# Patient Record
Sex: Female | Born: 1960 | Race: White | Hispanic: No | Marital: Single | Smoking: Current every day smoker
Health system: Southern US, Community
[De-identification: ages and names within clinical notes are randomized; demographics above are authoritative.]

## PROBLEM LIST (undated history)

## (undated) DIAGNOSIS — E039 Hypothyroidism, unspecified: Secondary | ICD-10-CM

## (undated) DIAGNOSIS — A4902 Methicillin resistant Staphylococcus aureus infection, unspecified site: Secondary | ICD-10-CM

## (undated) DIAGNOSIS — F319 Bipolar disorder, unspecified: Secondary | ICD-10-CM

## (undated) DIAGNOSIS — I10 Essential (primary) hypertension: Secondary | ICD-10-CM

## (undated) DIAGNOSIS — R569 Unspecified convulsions: Secondary | ICD-10-CM

## (undated) DIAGNOSIS — F1010 Alcohol abuse, uncomplicated: Secondary | ICD-10-CM

## (undated) HISTORY — PX: LEG SURGERY: SHX1003

## (undated) HISTORY — DX: Hypothyroidism, unspecified: E03.9

---

## 1998-04-03 ENCOUNTER — Ambulatory Visit (HOSPITAL_COMMUNITY): Admission: RE | Admit: 1998-04-03 | Discharge: 1998-04-03 | Payer: Self-pay | Admitting: Obstetrics and Gynecology

## 1998-07-09 ENCOUNTER — Encounter: Admission: RE | Admit: 1998-07-09 | Discharge: 1998-08-29 | Payer: Self-pay

## 1999-08-17 ENCOUNTER — Encounter: Admission: RE | Admit: 1999-08-17 | Discharge: 1999-08-17 | Payer: Self-pay | Admitting: Orthopedic Surgery

## 1999-08-17 ENCOUNTER — Encounter: Payer: Self-pay | Admitting: Orthopedic Surgery

## 1999-09-28 ENCOUNTER — Inpatient Hospital Stay (HOSPITAL_COMMUNITY): Admission: EM | Admit: 1999-09-28 | Discharge: 1999-10-03 | Payer: Self-pay | Admitting: Emergency Medicine

## 1999-09-28 ENCOUNTER — Encounter: Payer: Self-pay | Admitting: Emergency Medicine

## 1999-09-28 ENCOUNTER — Encounter: Payer: Self-pay | Admitting: Orthopedic Surgery

## 2000-08-17 ENCOUNTER — Emergency Department (HOSPITAL_COMMUNITY): Admission: EM | Admit: 2000-08-17 | Discharge: 2000-08-17 | Payer: Self-pay | Admitting: Emergency Medicine

## 2003-05-30 ENCOUNTER — Encounter (INDEPENDENT_AMBULATORY_CARE_PROVIDER_SITE_OTHER): Payer: Self-pay | Admitting: Specialist

## 2003-05-30 ENCOUNTER — Encounter: Admission: RE | Admit: 2003-05-30 | Discharge: 2003-05-30 | Payer: Self-pay | Admitting: Obstetrics and Gynecology

## 2006-05-06 ENCOUNTER — Ambulatory Visit: Payer: Self-pay | Admitting: Hospitalist

## 2006-06-09 ENCOUNTER — Emergency Department (HOSPITAL_COMMUNITY): Admission: EM | Admit: 2006-06-09 | Discharge: 2006-06-10 | Payer: Self-pay | Admitting: Emergency Medicine

## 2006-06-11 ENCOUNTER — Ambulatory Visit: Payer: Self-pay | Admitting: Hospitalist

## 2006-06-17 ENCOUNTER — Ambulatory Visit (HOSPITAL_COMMUNITY): Admission: RE | Admit: 2006-06-17 | Discharge: 2006-06-17 | Payer: Self-pay | Admitting: Internal Medicine

## 2006-06-24 ENCOUNTER — Ambulatory Visit: Payer: Self-pay | Admitting: Internal Medicine

## 2006-06-24 ENCOUNTER — Ambulatory Visit (HOSPITAL_COMMUNITY): Admission: RE | Admit: 2006-06-24 | Discharge: 2006-06-24 | Payer: Self-pay | Admitting: Internal Medicine

## 2006-07-10 ENCOUNTER — Ambulatory Visit: Payer: Self-pay | Admitting: Internal Medicine

## 2006-07-17 ENCOUNTER — Ambulatory Visit (HOSPITAL_COMMUNITY): Admission: RE | Admit: 2006-07-17 | Discharge: 2006-07-17 | Payer: Self-pay | Admitting: Internal Medicine

## 2006-10-26 ENCOUNTER — Telehealth: Payer: Self-pay | Admitting: *Deleted

## 2006-12-14 ENCOUNTER — Ambulatory Visit: Payer: Self-pay | Admitting: Internal Medicine

## 2006-12-14 DIAGNOSIS — F319 Bipolar disorder, unspecified: Secondary | ICD-10-CM | POA: Insufficient documentation

## 2006-12-14 DIAGNOSIS — J45909 Unspecified asthma, uncomplicated: Secondary | ICD-10-CM | POA: Insufficient documentation

## 2006-12-14 DIAGNOSIS — R569 Unspecified convulsions: Secondary | ICD-10-CM

## 2006-12-18 IMAGING — CR DG CHEST 2V
2 series · 2 of 2 positions shown · non-contrast
Comparison: 06/09/06.

CLINICAL DATA: Pneumonia.  
 CHEST - 2 VIEW - 07/17/06:

[view not recorded (1 of 2)]
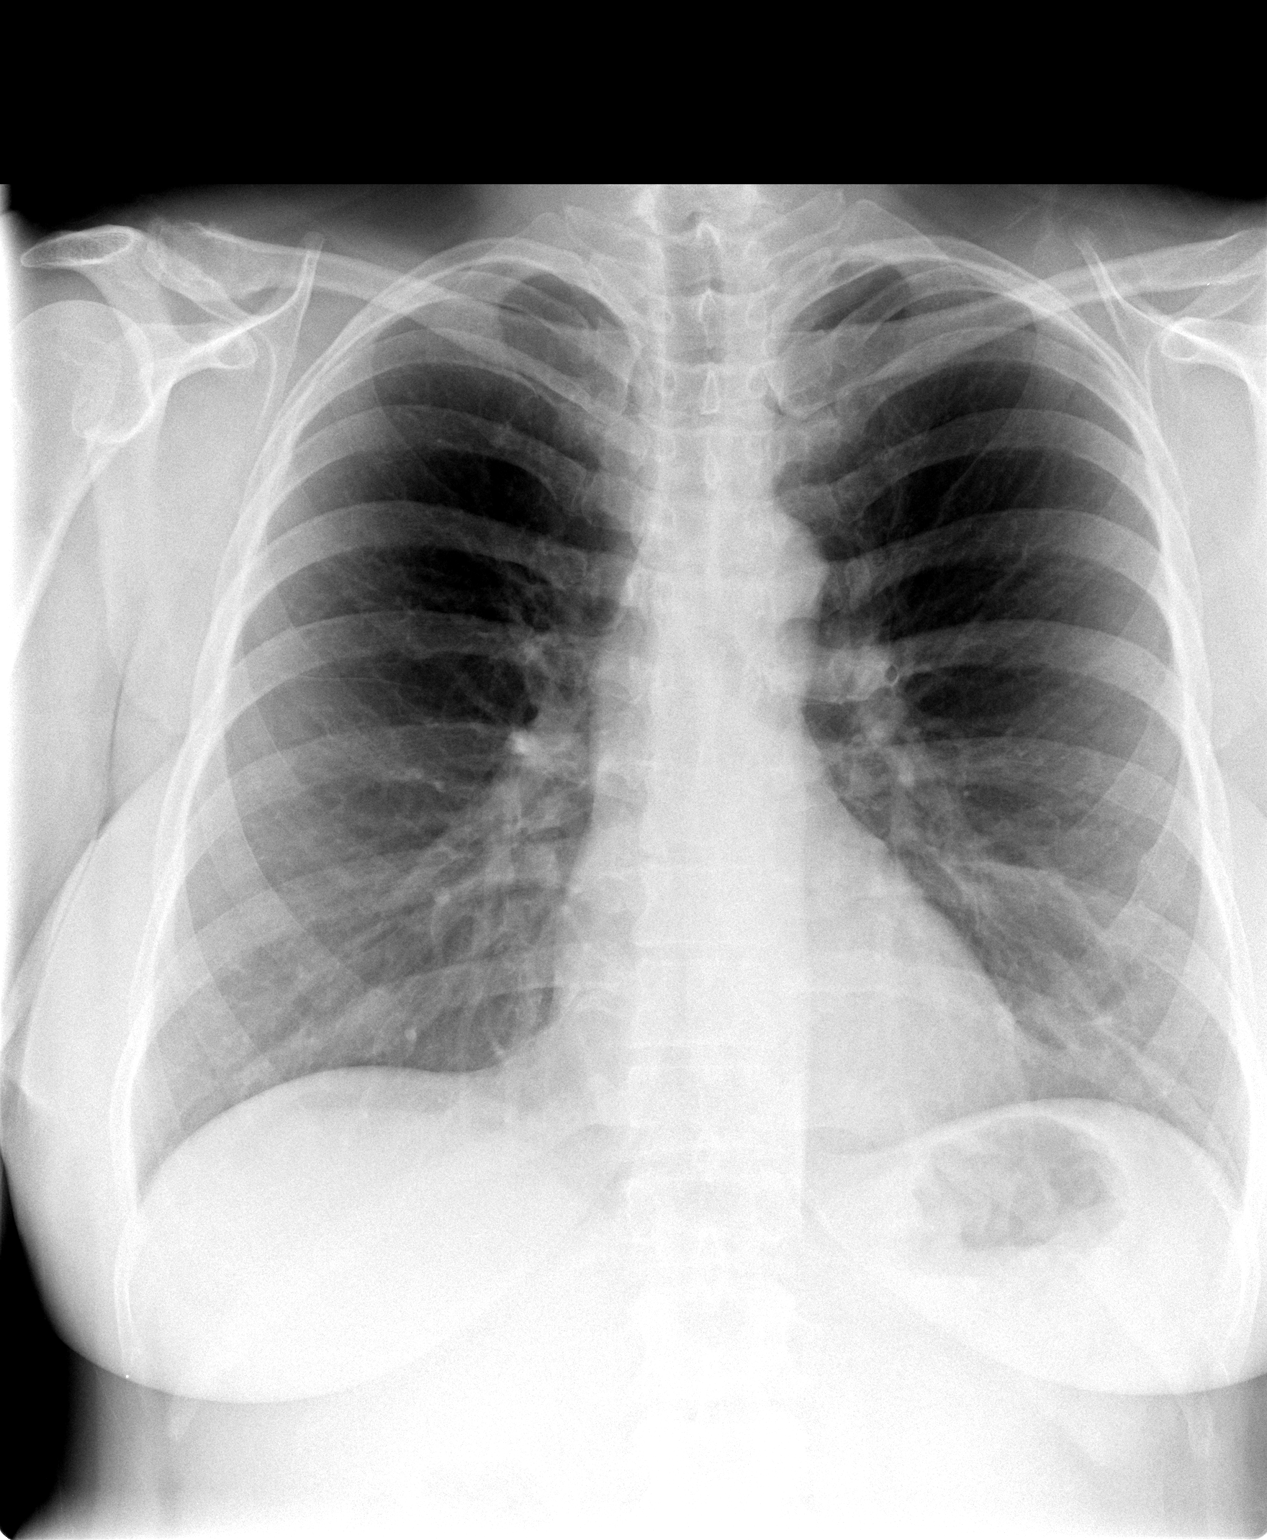

[view not recorded (2 of 2)]
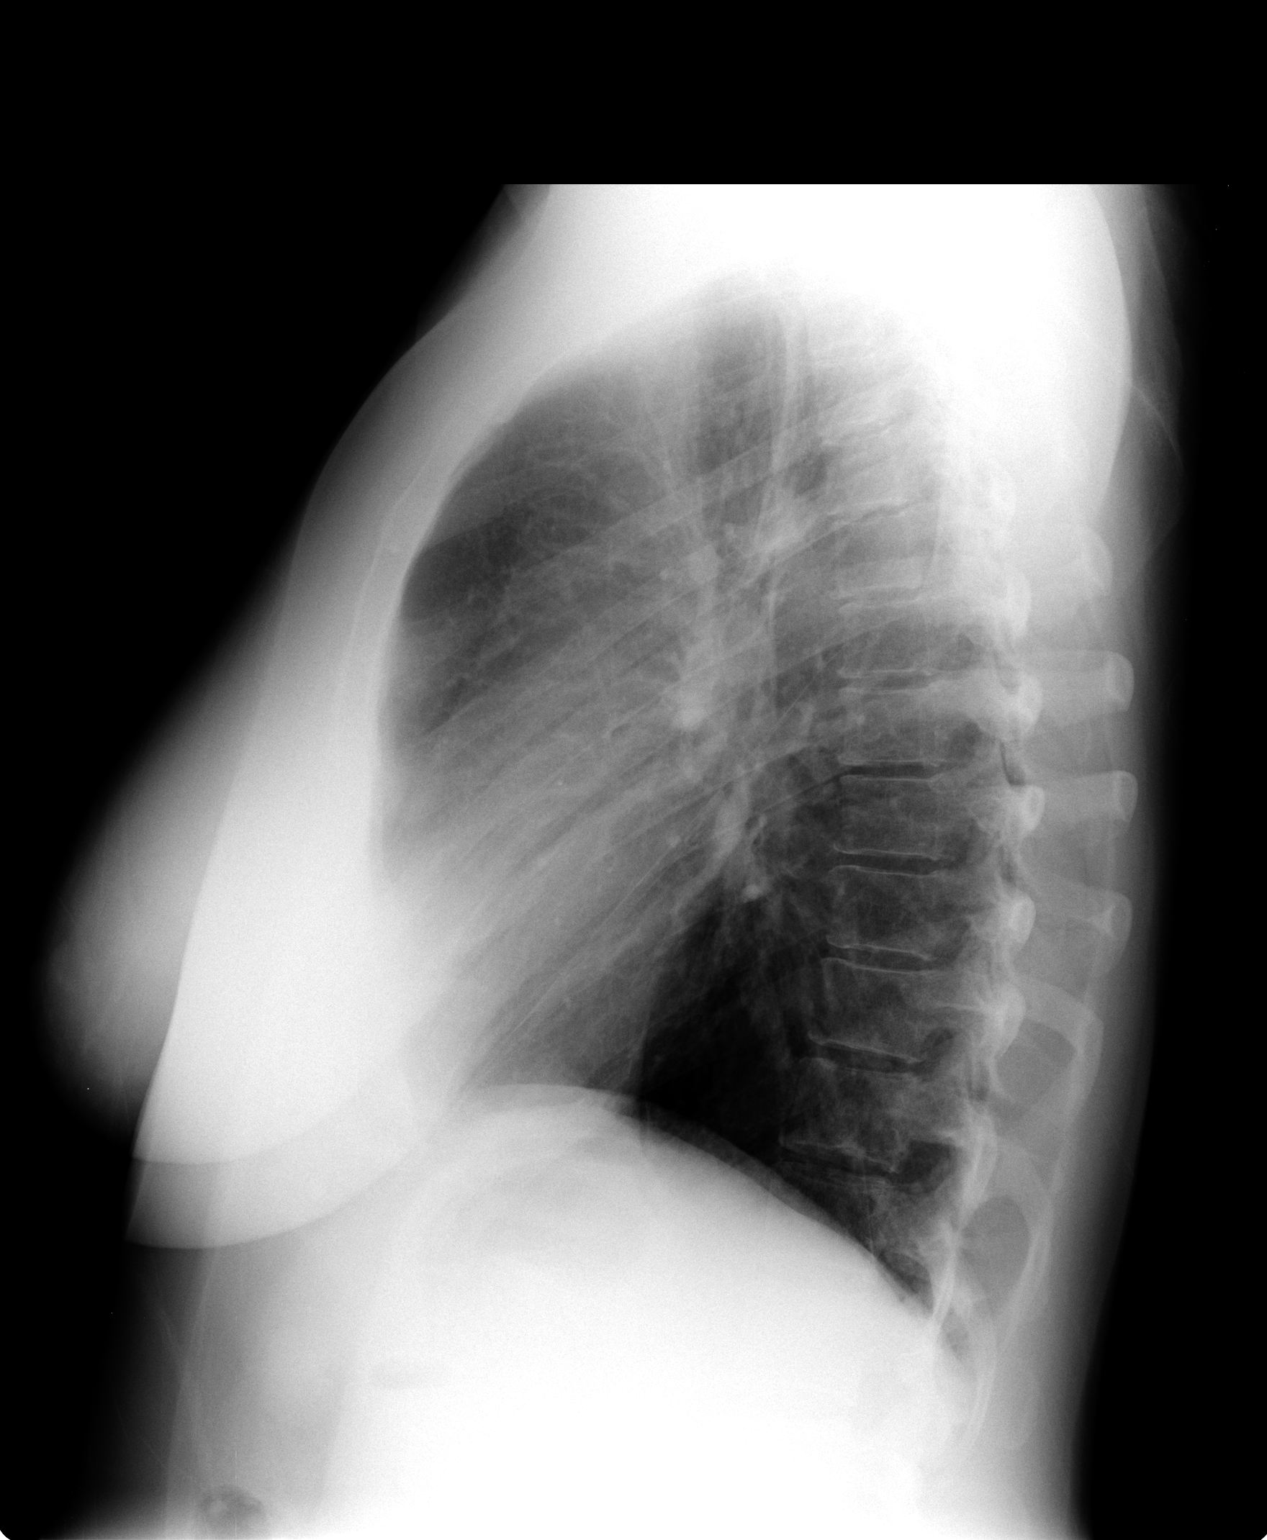

[2 of 2 positions shown; findings below may reference images not displayed]

FINDINGS: Interval resolution of the previously noted left lower lobe infiltrate.  The lungs are presently clear.  The cardiac and mediastinal contours are normal.
IMPRESSION: Resolution of left lower lobe infiltrate.

## 2007-01-22 ENCOUNTER — Ambulatory Visit: Payer: Self-pay | Admitting: *Deleted

## 2007-01-22 DIAGNOSIS — J309 Allergic rhinitis, unspecified: Secondary | ICD-10-CM | POA: Insufficient documentation

## 2007-03-26 ENCOUNTER — Telehealth (INDEPENDENT_AMBULATORY_CARE_PROVIDER_SITE_OTHER): Payer: Self-pay | Admitting: *Deleted

## 2007-04-26 ENCOUNTER — Ambulatory Visit: Payer: Self-pay | Admitting: *Deleted

## 2007-05-25 ENCOUNTER — Telehealth (INDEPENDENT_AMBULATORY_CARE_PROVIDER_SITE_OTHER): Payer: Self-pay | Admitting: Internal Medicine

## 2007-07-15 ENCOUNTER — Telehealth (INDEPENDENT_AMBULATORY_CARE_PROVIDER_SITE_OTHER): Payer: Self-pay | Admitting: *Deleted

## 2007-07-21 ENCOUNTER — Encounter (INDEPENDENT_AMBULATORY_CARE_PROVIDER_SITE_OTHER): Payer: Self-pay | Admitting: Internal Medicine

## 2008-02-15 ENCOUNTER — Ambulatory Visit: Payer: Self-pay | Admitting: *Deleted

## 2008-02-15 ENCOUNTER — Encounter (INDEPENDENT_AMBULATORY_CARE_PROVIDER_SITE_OTHER): Payer: Self-pay | Admitting: Internal Medicine

## 2008-02-15 DIAGNOSIS — E039 Hypothyroidism, unspecified: Secondary | ICD-10-CM | POA: Insufficient documentation

## 2008-02-22 LAB — CONVERTED CEMR LAB
ALT: 9 units/L (ref 0–35)
AST: 15 units/L (ref 0–37)
Albumin: 4.5 g/dL (ref 3.5–5.2)
BUN: 13 mg/dL (ref 6–23)
Basophils Absolute: 0 10*3/uL (ref 0.0–0.1)
Basophils Relative: 0 % (ref 0–1)
Calcium: 10.1 mg/dL (ref 8.4–10.5)
Chloride: 110 meq/L (ref 96–112)
Cholesterol: 175 mg/dL (ref 0–200)
HCT: 33 % — ABNORMAL LOW (ref 36.0–46.0)
HDL: 39 mg/dL — ABNORMAL LOW (ref >39)
Monocytes Absolute: 1 10*3/uL (ref 0.1–1.0)
Neutro Abs: 3.4 10*3/uL (ref 1.7–7.7)
Neutrophils Relative %: 47 % (ref 43–77)
Platelets: 313 10*3/uL (ref 150–400)
Potassium: 4.7 meq/L (ref 3.5–5.3)
RDW: 15.3 % (ref 11.5–15.5)
TSH: 0.024 microintl units/mL — ABNORMAL LOW (ref 0.350–5.50)
Total CHOL/HDL Ratio: 4.5
Total Protein: 7 g/dL (ref 6.0–8.3)
Triglycerides: 78 mg/dL (ref <150)
VLDL: 16 mg/dL (ref 0–40)

## 2008-03-03 ENCOUNTER — Ambulatory Visit: Payer: Self-pay | Admitting: Internal Medicine

## 2008-03-03 ENCOUNTER — Encounter (INDEPENDENT_AMBULATORY_CARE_PROVIDER_SITE_OTHER): Payer: Self-pay | Admitting: Internal Medicine

## 2008-03-03 DIAGNOSIS — D649 Anemia, unspecified: Secondary | ICD-10-CM

## 2008-03-03 LAB — CONVERTED CEMR LAB
Iron: 50 ug/dL (ref 42–145)
Retic Ct Pct: 0.5 % (ref 0.4–3.1)
Vitamin B-12: 342 pg/mL (ref 211–911)

## 2008-03-06 ENCOUNTER — Telehealth (INDEPENDENT_AMBULATORY_CARE_PROVIDER_SITE_OTHER): Payer: Self-pay | Admitting: Internal Medicine

## 2008-04-24 ENCOUNTER — Telehealth (INDEPENDENT_AMBULATORY_CARE_PROVIDER_SITE_OTHER): Payer: Self-pay | Admitting: Internal Medicine

## 2008-06-28 ENCOUNTER — Ambulatory Visit (HOSPITAL_COMMUNITY): Admission: RE | Admit: 2008-06-28 | Discharge: 2008-06-28 | Payer: Self-pay | Admitting: Internal Medicine

## 2008-08-29 ENCOUNTER — Ambulatory Visit: Payer: Self-pay | Admitting: Internal Medicine

## 2008-09-06 ENCOUNTER — Encounter (INDEPENDENT_AMBULATORY_CARE_PROVIDER_SITE_OTHER): Payer: Self-pay | Admitting: Internal Medicine

## 2008-09-12 DIAGNOSIS — F17200 Nicotine dependence, unspecified, uncomplicated: Secondary | ICD-10-CM | POA: Insufficient documentation

## 2008-10-20 ENCOUNTER — Encounter: Payer: Self-pay | Admitting: Internal Medicine

## 2008-10-20 ENCOUNTER — Ambulatory Visit: Payer: Self-pay | Admitting: Internal Medicine

## 2008-10-20 LAB — CONVERTED CEMR LAB
Nitrite: POSITIVE
Protein, U semiquant: 30
Urobilinogen, UA: 1
pH: 6

## 2008-10-27 LAB — CONVERTED CEMR LAB
AST: 11 units/L (ref 0–37)
Albumin: 4 g/dL (ref 3.5–5.2)
Alkaline Phosphatase: 56 units/L (ref 39–117)
BUN: 12 mg/dL (ref 6–23)
Free T4: 0.88 ng/dL — ABNORMAL LOW (ref 0.89–1.80)
HCT: 31.3 % — ABNORMAL LOW (ref 36.0–46.0)
Hemoglobin: 10.1 g/dL — ABNORMAL LOW (ref 12.0–15.0)
Ketones, ur: NEGATIVE mg/dL
Lithium Lvl: 0.78 meq/L — ABNORMAL LOW (ref 0.80–1.40)
MCV: 89.9 fL (ref 78.0–100.0)
Nitrite: POSITIVE — AB
Potassium: 4.3 meq/L (ref 3.5–5.3)
Specific Gravity, Urine: 1.013 (ref 1.005–1.03)
Total Protein: 6.5 g/dL (ref 6.0–8.3)
Urine Glucose: NEGATIVE mg/dL
Urobilinogen, UA: 1 (ref 0.0–1.0)
WBC: 7.1 10*3/uL (ref 4.0–10.5)

## 2008-11-23 ENCOUNTER — Ambulatory Visit: Payer: Self-pay | Admitting: Internal Medicine

## 2008-12-26 ENCOUNTER — Telehealth (INDEPENDENT_AMBULATORY_CARE_PROVIDER_SITE_OTHER): Payer: Self-pay | Admitting: Internal Medicine

## 2009-01-04 ENCOUNTER — Encounter (INDEPENDENT_AMBULATORY_CARE_PROVIDER_SITE_OTHER): Payer: Self-pay | Admitting: Internal Medicine

## 2009-01-08 ENCOUNTER — Telehealth (INDEPENDENT_AMBULATORY_CARE_PROVIDER_SITE_OTHER): Payer: Self-pay | Admitting: *Deleted

## 2009-03-16 ENCOUNTER — Encounter: Payer: Self-pay | Admitting: *Deleted

## 2009-04-12 ENCOUNTER — Ambulatory Visit: Payer: Self-pay | Admitting: Internal Medicine

## 2009-04-12 ENCOUNTER — Encounter: Payer: Self-pay | Admitting: Internal Medicine

## 2009-04-12 DIAGNOSIS — M545 Low back pain: Secondary | ICD-10-CM

## 2009-04-13 LAB — CONVERTED CEMR LAB: TSH: 0.309 microintl units/mL — ABNORMAL LOW (ref 0.350–4.5)

## 2009-05-22 ENCOUNTER — Ambulatory Visit: Payer: Self-pay | Admitting: Infectious Diseases

## 2009-05-22 DIAGNOSIS — F411 Generalized anxiety disorder: Secondary | ICD-10-CM | POA: Insufficient documentation

## 2009-05-24 ENCOUNTER — Encounter: Payer: Self-pay | Admitting: Internal Medicine

## 2009-05-24 ENCOUNTER — Ambulatory Visit: Payer: Self-pay | Admitting: Internal Medicine

## 2009-05-24 LAB — CONVERTED CEMR LAB
ALT: 13 units/L (ref 0–35)
AST: 19 units/L (ref 0–37)
Albumin: 4.5 g/dL (ref 3.5–5.2)
Alkaline Phosphatase: 42 units/L (ref 39–117)
CO2: 22 meq/L (ref 19–32)
Calcium: 10.1 mg/dL (ref 8.4–10.5)
Chloride: 110 meq/L (ref 96–112)
Cholesterol: 150 mg/dL (ref 0–200)
HDL: 54 mg/dL (ref >39)
LDL Cholesterol: 90 mg/dL (ref 0–99)
Platelets: 292 10*3/uL (ref 150–400)
Potassium: 4.9 meq/L (ref 3.5–5.3)
RDW: 14.2 % (ref 11.5–15.5)
Sodium: 139 meq/L (ref 135–145)
TSH: 0.653 microintl units/mL (ref 0.350–4.5)
Total Protein: 6.9 g/dL (ref 6.0–8.3)
Triglycerides: 28 mg/dL (ref <150)
VLDL: 6 mg/dL (ref 0–40)

## 2009-09-19 ENCOUNTER — Telehealth: Payer: Self-pay | Admitting: Internal Medicine

## 2009-10-01 ENCOUNTER — Ambulatory Visit (HOSPITAL_COMMUNITY): Admission: RE | Admit: 2009-10-01 | Discharge: 2009-10-01 | Payer: Self-pay | Admitting: Internal Medicine

## 2009-10-01 ENCOUNTER — Ambulatory Visit: Payer: Self-pay | Admitting: Internal Medicine

## 2009-10-01 ENCOUNTER — Encounter: Payer: Self-pay | Admitting: Internal Medicine

## 2009-10-02 ENCOUNTER — Encounter: Payer: Self-pay | Admitting: Internal Medicine

## 2009-10-08 ENCOUNTER — Telehealth: Payer: Self-pay | Admitting: Internal Medicine

## 2009-10-26 ENCOUNTER — Telehealth: Payer: Self-pay | Admitting: Internal Medicine

## 2009-12-20 ENCOUNTER — Telehealth: Payer: Self-pay | Admitting: *Deleted

## 2009-12-21 ENCOUNTER — Encounter: Payer: Self-pay | Admitting: Internal Medicine

## 2010-01-23 ENCOUNTER — Encounter: Admission: RE | Admit: 2010-01-23 | Discharge: 2010-01-23 | Payer: Self-pay | Admitting: Sports Medicine

## 2010-02-13 ENCOUNTER — Encounter: Admission: RE | Admit: 2010-02-13 | Discharge: 2010-02-13 | Payer: Self-pay | Admitting: Sports Medicine

## 2010-02-22 ENCOUNTER — Telehealth: Payer: Self-pay | Admitting: Internal Medicine

## 2010-04-03 ENCOUNTER — Encounter: Admission: RE | Admit: 2010-04-03 | Discharge: 2010-04-03 | Payer: Self-pay | Admitting: Sports Medicine

## 2010-05-28 ENCOUNTER — Telehealth: Payer: Self-pay | Admitting: Internal Medicine

## 2010-06-07 ENCOUNTER — Ambulatory Visit: Payer: Self-pay | Admitting: Internal Medicine

## 2010-06-07 DIAGNOSIS — F3289 Other specified depressive episodes: Secondary | ICD-10-CM | POA: Insufficient documentation

## 2010-06-07 DIAGNOSIS — F329 Major depressive disorder, single episode, unspecified: Secondary | ICD-10-CM

## 2010-06-07 DIAGNOSIS — F1021 Alcohol dependence, in remission: Secondary | ICD-10-CM

## 2010-06-10 LAB — CONVERTED CEMR LAB: Creatinine, Ser: 0.7 mg/dL (ref 0.4–1.2)

## 2010-07-04 ENCOUNTER — Telehealth: Payer: Self-pay | Admitting: Internal Medicine

## 2010-09-20 ENCOUNTER — Encounter
Admission: RE | Admit: 2010-09-20 | Discharge: 2010-09-20 | Payer: Self-pay | Source: Home / Self Care | Attending: Sports Medicine | Admitting: Sports Medicine

## 2010-10-06 ENCOUNTER — Encounter: Payer: Self-pay | Admitting: Sports Medicine

## 2010-10-17 ENCOUNTER — Other Ambulatory Visit: Payer: Self-pay | Admitting: *Deleted

## 2010-10-17 NOTE — Progress Notes (Signed)
Summary: refill/ hla  Phone Note Refill Request Message from:  Patient on October 26, 2009 10:12 AM  Refills Requested: Medication #1:  AMBIEN 5 MG  TABS Take 1 tablet by mouth at bedtime   Dosage confirmed as above?Dosage Confirmed   Last Refilled: 1/27 1/27 #15 per pharm by dr Scot Dock  Initial call taken by: Marin Roberts RN,  October 26, 2009 10:14 AM  Follow-up for Phone Call        Refill approved-nurse to complete. Please request the #15 pill prescription be faxed for our records.  Thanks    Prescriptions: AMBIEN 5 MG  TABS (ZOLPIDEM TARTRATE) Take 1 tablet by mouth at bedtime, may repeat x 1 in 30 minutes if no effect after the first dose.  #45 x 2   Entered and Authorized by:   Bethel Born MD   Signed by:   Bethel Born MD on 10/26/2009   Method used:   Telephoned to ...       [facility]  [address] [phone]* (retail)       [address]       [place], [place]  [address]       Ph: [phone] or [phone]       Fax: [phone]   RxID:   [id number]   Appended Document: refill/ hla Above rx called to [facility].  Copy of last Rx to be scanned in (11/2008).  The pharmacy said d/t pt.'s insurance, she can only receive #15 tablets at one time.

## 2010-10-17 NOTE — Miscellaneous (Signed)
Summary: [facility]   Imported By: Margie Billet 01/28/2010 11:12:18  _____________________________________________________________________  External Attachment:    Type:   Image     Comment:   External Document
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
## 2010-10-17 NOTE — Progress Notes (Signed)
Summary: Refill/gh  Phone Note Refill Request Message from:  Fax from Pharmacy on July 04, 2010 12:23 PM  Refills Requested: Medication #1:  SUMATRIPTAN SUCCINATE 50 MG TABS once daily as needed   Dosage confirmed as above?Dosage Confirmed   Last Refilled: 05/07/2010  Method Requested: Electronic Initial call taken by: Angelina Ok RN,  July 04, 2010 12:23 PM  Follow-up for Phone Call       Follow-up by: Bethel Born MD,  July 04, 2010 12:51 PM    Prescriptions: SUMATRIPTAN SUCCINATE 50 MG TABS (SUMATRIPTAN SUCCINATE) once daily as needed  #18 x 6   Entered and Authorized by:   Bethel Born MD   Signed by:   Bethel Born MD on 07/04/2010   Method used:   Electronically to        [facility] [address] [phone]* (retail)       [address]       [place], [place]  [address]       Ph: [phone] or [phone]       Fax: [phone]   RxID:   [id number]

## 2010-10-17 NOTE — Progress Notes (Signed)
Summary: refill/ hla  Phone Note Refill Request Message from:  Fax from Pharmacy on May 28, 2010 9:48 AM  Refills Requested: Medication #1:  LEVOTHROID 88 MCG TABS Take 1 tablet by mouth once a day   Dosage confirmed as above?Dosage Confirmed   Last Refilled: 8/15 last appt 09/2009, last labs 1/18  Initial call taken by: Marin Roberts RN,  May 28, 2010 9:48 AM  Follow-up for Phone Call       Follow-up by: Bethel Born MD,  May 28, 2010 3:33 PM    Prescriptions: LEVOTHROID 88 MCG TABS (LEVOTHYROXINE SODIUM) Take 1 tablet by mouth once a day  #30 x 11   Entered and Authorized by:   Bethel Born MD   Signed by:   Bethel Born MD on 05/28/2010   Method used:   Electronically to        [facility] [address] [phone]* (retail)       [address]       [place], [place]  [address]       Ph: [phone] or [phone]       Fax: [phone]   RxID:   [id number]

## 2010-10-17 NOTE — Progress Notes (Signed)
Summary: Refill/gh  Phone Note Refill Request Message from:  Fax from Pharmacy on February 22, 2010 9:03 AM  Refills Requested: Medication #1:  AMBIEN 5 MG  TABS Take 1 tablet by mouth at bedtime   Last Refilled: 02/10/2010  Method Requested: Electronic Initial call taken by: Angelina Ok RN,  February 22, 2010 9:03 AM  Follow-up for Phone Call        Refill approved-nurse to complete Follow-up by: Bethel Born MD,  February 22, 2010 12:36 PM    Prescriptions: AMBIEN 5 MG  TABS (ZOLPIDEM TARTRATE) Take 1 tablet by mouth at bedtime, may repeat x 1 in 30 minutes if no effect after the first dose.  #45 x 2   Entered and Authorized by:   Bethel Born MD   Signed by:   Bethel Born MD on 02/22/2010   Method used:   Telephoned to ...       [facility]  [address] [phone]* (retail)       [address]       [place], [place]  [address]       Ph: [phone] or [phone]       Fax: [phone]   RxID:   [id number]

## 2010-10-17 NOTE — Progress Notes (Signed)
Summary: phone/gg  **  Phone Note Call from Patient   Caller: Patient Summary of Call: Pt called wanting the results of her xrays. She is still having back pain.  She has taken tramadol without any relief.  SHe takes it two times a day.  What else can she do? Pt # [id number] Initial call taken by: Merrie Roof RN,  October 08, 2009 10:00 AM  Follow-up for Phone Call        Pt needs to continue using tramadol as prescribed, following instructions for back exercise and to apply warm compresses in the area. X-ray is normal. She can use tylenol 650mg  every 6 hours as needed for pain as well. Make sure patient got and use the patch previously prescribed.  Thanks!!!!!     Appended Document: phone/gg  ** Pt informed and voices understanding

## 2010-10-17 NOTE — Progress Notes (Signed)
Summary: med refill/gp  Phone Note Refill Request Message from:  Fax from Pharmacy on September 19, 2009 2:52 PM  Refills Requested: Medication #1:  ALLEGRA 180 MG TABS Take 1 tablet by mouth once a day for allergies   Dosage confirmed as above?Dosage Confirmed   Supply Requested: 1 year   Last Refilled: 07/27/2009  Method Requested: Electronic Initial call taken by: Chinita Pester RN,  September 19, 2009 2:52 PM  Follow-up for Phone Call        Please schedule a follow-up appointment with Dr. Scot Dock in a non-overbook slot in 2 months.  Thank You. Follow-up by: Doneen Poisson MD,  September 19, 2009 4:31 PM  Additional Follow-up for Phone Call Additional follow up Details #1::        Flag sent to [facility] for an appt. Additional Follow-up by: Chinita Pester RN,  September 19, 2009 5:08 PM    Prescriptions: ALLEGRA 180 MG TABS (FEXOFENADINE HCL) Take 1 tablet by mouth once a day for allergies  #30 Tablet x 11   Entered and Authorized by:   Doneen Poisson MD   Signed by:   Doneen Poisson MD on 09/19/2009   Method used:   Electronically to        [facility] [address] [phone]* (retail)       [address]       [place], [place]  [address]       Ph: [phone] or [phone]       Fax: [phone]   RxID:   [id number]   Appended Document: med refill/gp Pt.has an appt. w/Dr. Scot Dock  10/16/09.

## 2010-10-17 NOTE — Progress Notes (Signed)
Summary: request for records/ hla  Phone Note Call from Patient   Summary of Call: pt calls to ask why the [facility] has not sent her records to [facility] office, i see no referral to [facility], informed pt she will need to sign a release of info since [facility] did not refer, she states she spoke w/ a nurse this week who told her the records would be sent, i ask the nurse's name that she spoke with, she cannot tell me the name, she was angry, i apologized for the mistake and ask her to either come by and sign or have drs murphy, [facility]'s office call and request the records after she signs a release of info there, i then apologized again and she seemed much nicer. she states she will have drs m-w request records Initial call taken by: Marin Roberts RN,  December 20, 2009 10:38 AM
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
## 2010-10-19 ENCOUNTER — Other Ambulatory Visit: Payer: Self-pay | Admitting: *Deleted

## 2010-11-06 NOTE — Telephone Encounter (Signed)
Opened chart for review.
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
## 2011-01-31 NOTE — Op Note (Signed)
[place]. [facility]  Patient:    Kathleen Day                      MRN: [id number] Proc. Date: 09/29/99 Adm. Date:  [id number] Attending:  Colbert Ewing                           Operative Report  PREOPERATIVE DIAGNOSIS:  Abscess proximal tibial, left, with extension into anteromedial soft tissue abscess.  POSTOPERATIVE DIAGNOSIS:  Abscess proximal tibial, left, with extension into anteromedial soft tissue abscess.  OPERATION:  Incision and drainage of soft tissue abscess as well as interosseous bony abscess, proximal tibia, anteromedial leg, left.  SURGEON:  Loreta Ave, M.D.  ASSISTANT:  Arlys John D. Petrarca, P.A.-C.  ANESTHESIA:  General.  ESTIMATED BLOOD LOSS:  Minimal.  TOURNIQUET TIME:  30 minutes.  SPECIMENS:  Bone was sent for culture.  Fluid also sent for aerobic and anaerobic culture with findings revealing gross pus both in the soft tissue and in the tibia. The wound was packed open.  DESCRIPTION OF PROCEDURE:  The patient was brought to the operating room. After adequate anesthesia had been obtained, tourniquet was applied about the upper aspect of the left leg, prepped and draped in the usual sterile fashion.  The wound was opened with a longitudinal incision just medial to the tibia tubercle where  there was an obvious soft tissue abscess localized there.  Skin and subcutaneous tissue divided.  Immediately entering, an abscess under pressure in that area, yielding gross purulence of slight greenish tinted purulent material.  This had  extended just above the fascial plane in all directions from there and was completely evacuated.  At the base of the lesion was an opening within the cortex of the tibia, connecting down to an interosseous bony abscess.  This opening was enlarged in order to allow access with an oval enlargement of approximately 1 cm to 1.5 cm in length.  Immediately entered a large cavity  within the tibia with more pus and slightly loculated.  This was completely evacuated out.  Fluid was sent for aerobic and anaerobic culture as well as Gram stain.  The cavity of the tibia was then curetted back to healthy tissue in all aspects removing the fibrinous debris and wall of the abscess throughout back to healthy bone.  The wound was copiously irrigated with antibiotic solution as well 6 liters of saline with the pulsed irrigating device.  All recesses were thoroughly assessed and examined to be sure we had drained all purulent material.  The abscess itself did not extend up into the knee joint from below, and previous aspiration of the knee revealed no fluid within the knee itself.  The bony lesion as well as soft tissue lesion were packed open with Betadine-soaked gauze.  The margins of the vertical incision were reapproximated slightly with nylon, leaving a generous area open to allow appropriate egress.  Sterile compressive dressing was applied, tourniquet deflated, anesthesia reversed, and patient brought to the recovery room.  The patient tolerated the surgery well with no complications. DD:  09/29/99 TD:  09/29/99 Job: [id number]
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
## 2011-06-17 ENCOUNTER — Other Ambulatory Visit: Payer: Self-pay | Admitting: Internal Medicine

## 2011-10-15 ENCOUNTER — Other Ambulatory Visit: Payer: Self-pay | Admitting: Internal Medicine

## 2011-10-29 ENCOUNTER — Encounter (HOSPITAL_COMMUNITY): Payer: Self-pay | Admitting: *Deleted

## 2011-10-29 ENCOUNTER — Emergency Department (HOSPITAL_COMMUNITY)
Admission: EM | Admit: 2011-10-29 | Discharge: 2011-10-30 | Disposition: A | Discharge status: Other Acute Inpatient Hospital | Payer: Medicare Other | Attending: Emergency Medicine | Admitting: Emergency Medicine

## 2011-10-29 DIAGNOSIS — F1210 Cannabis abuse, uncomplicated: Secondary | ICD-10-CM | POA: Insufficient documentation

## 2011-10-29 DIAGNOSIS — F1010 Alcohol abuse, uncomplicated: Secondary | ICD-10-CM | POA: Insufficient documentation

## 2011-10-29 DIAGNOSIS — E039 Hypothyroidism, unspecified: Secondary | ICD-10-CM | POA: Insufficient documentation

## 2011-10-29 DIAGNOSIS — F329 Major depressive disorder, single episode, unspecified: Secondary | ICD-10-CM | POA: Insufficient documentation

## 2011-10-29 DIAGNOSIS — F3289 Other specified depressive episodes: Secondary | ICD-10-CM | POA: Insufficient documentation

## 2011-10-29 DIAGNOSIS — I10 Essential (primary) hypertension: Secondary | ICD-10-CM | POA: Insufficient documentation

## 2011-10-29 HISTORY — DX: Unspecified convulsions: R56.9

## 2011-10-29 HISTORY — DX: Methicillin resistant Staphylococcus aureus infection, unspecified site: A49.02

## 2011-10-29 HISTORY — DX: Alcohol abuse, uncomplicated: F10.10

## 2011-10-29 HISTORY — DX: Essential (primary) hypertension: I10

## 2011-10-29 HISTORY — DX: Bipolar disorder, unspecified: F31.9

## 2011-10-29 LAB — COMPREHENSIVE METABOLIC PANEL
AST: 36 U/L (ref 0–37)
BUN: 8 mg/dL (ref 6–23)
CO2: 25 mEq/L (ref 19–32)
Chloride: 103 mEq/L (ref 96–112)
Creatinine, Ser: 0.61 mg/dL (ref 0.50–1.10)
GFR calc non Af Amer: >90 mL/min (ref >90)
Glucose, Bld: 88 mg/dL (ref 70–99)
Sodium: 141 mEq/L (ref 135–145)
Total Bilirubin: 0.3 mg/dL (ref 0.3–1.2)
Total Protein: 9.1 g/dL — ABNORMAL HIGH (ref 6.0–8.3)

## 2011-10-29 LAB — CBC
HCT: 44.7 % (ref 36.0–46.0)
Hemoglobin: 15.6 g/dL — ABNORMAL HIGH (ref 12.0–15.0)
MCH: 31.9 pg (ref 26.0–34.0)
MCHC: 34.9 g/dL (ref 30.0–36.0)
MCV: 91.4 fL (ref 78.0–100.0)

## 2011-10-29 LAB — RAPID URINE DRUG SCREEN, HOSP PERFORMED
Amphetamines: POSITIVE — AB
Barbiturates: NOT DETECTED
Opiates: NOT DETECTED
Tetrahydrocannabinol: POSITIVE — AB

## 2011-10-29 LAB — ACETAMINOPHEN LEVEL: Acetaminophen (Tylenol), Serum: <15 ug/mL (ref 10–30)

## 2011-10-29 LAB — ETHANOL: Alcohol, Ethyl (B): 137 mg/dL — ABNORMAL HIGH (ref 0–11)

## 2011-10-29 MED ORDER — NICOTINE 21 MG/24HR TD PT24
21.0000 mg | MEDICATED_PATCH | Freq: Every day | TRANSDERMAL | Status: DC
  Administered 2011-10-29 – 2011-10-30 (×2): 21 mg via TRANSDERMAL
  Filled 2011-10-29 (×2): qty 1

## 2011-10-29 MED ORDER — LORAZEPAM 1 MG PO TABS
0.0000 mg | ORAL_TABLET | Freq: Four times a day (QID) | ORAL | Status: DC
  Administered 2011-10-29: 2 mg via ORAL
  Administered 2011-10-29 – 2011-10-30 (×2): 1 mg via ORAL
  Filled 2011-10-29: qty 2
  Filled 2011-10-29: qty 1
  Filled 2011-10-29: qty 2

## 2011-10-29 MED ORDER — FOLIC ACID 1 MG PO TABS
1.0000 mg | ORAL_TABLET | Freq: Every day | ORAL | Status: DC
  Administered 2011-10-29 – 2011-10-30 (×2): 1 mg via ORAL
  Filled 2011-10-29 (×2): qty 1

## 2011-10-29 MED ORDER — VITAMIN B-1 100 MG PO TABS
100.0000 mg | ORAL_TABLET | Freq: Every day | ORAL | Status: DC
  Administered 2011-10-29 – 2011-10-30 (×2): 100 mg via ORAL
  Filled 2011-10-29 (×2): qty 1

## 2011-10-29 MED ORDER — ADULT MULTIVITAMIN W/MINERALS CH
1.0000 | ORAL_TABLET | Freq: Every day | ORAL | Status: DC
  Administered 2011-10-29 – 2011-10-30 (×2): 1 via ORAL
  Filled 2011-10-29 (×2): qty 1

## 2011-10-29 MED ORDER — IBUPROFEN 200 MG PO TABS
600.0000 mg | ORAL_TABLET | Freq: Three times a day (TID) | ORAL | Status: DC | PRN
  Administered 2011-10-29: 600 mg via ORAL
  Filled 2011-10-29: qty 3

## 2011-10-29 MED ORDER — LORAZEPAM 2 MG/ML IJ SOLN: 1.0000 mg | Freq: Four times a day (QID) | INTRAMUSCULAR | Status: DC | PRN

## 2011-10-29 MED ORDER — ONDANSETRON HCL 4 MG PO TABS: 4.0000 mg | ORAL_TABLET | Freq: Three times a day (TID) | ORAL | Status: DC | PRN

## 2011-10-29 MED ORDER — LORAZEPAM 1 MG PO TABS
1.0000 mg | ORAL_TABLET | Freq: Four times a day (QID) | ORAL | Status: DC | PRN
  Administered 2011-10-29 – 2011-10-30 (×3): 1 mg via ORAL
  Filled 2011-10-29 (×3): qty 1

## 2011-10-29 MED ORDER — ZOLPIDEM TARTRATE 5 MG PO TABS: 5.0000 mg | ORAL_TABLET | Freq: Every evening | ORAL | Status: DC | PRN

## 2011-10-29 MED ORDER — ALUM & MAG HYDROXIDE-SIMETH 200-200-20 MG/5ML PO SUSP
30.0000 mL | ORAL | Status: DC | PRN
Start: 2011-10-29 — End: 2011-10-30

## 2011-10-29 MED ORDER — THIAMINE HCL 100 MG/ML IJ SOLN: 100.0000 mg | Freq: Every day | INTRAMUSCULAR | Status: DC

## 2011-10-29 MED ORDER — LORAZEPAM 1 MG PO TABS
0.0000 mg | ORAL_TABLET | Freq: Two times a day (BID) | ORAL | Status: DC
Start: 2011-10-31 — End: 2011-10-30

## 2011-10-29 MED ORDER — SODIUM CHLORIDE 0.9 % IV BOLUS (SEPSIS)
1000.0000 mL | Freq: Once | INTRAVENOUS | Status: AC
  Administered 2011-10-29: 1000 mL via INTRAVENOUS
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
## 2011-10-29 NOTE — ED Notes (Signed)
Pt belongings behind nursing station; have been wanded by security.

## 2011-10-29 NOTE — ED Notes (Signed)
Pt care assumed, obtained verbal report.  Pt ambulated to the bed from the stretcher in the hall with steady gait.  Pt denies any complaints other than a h/a.
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
## 2011-10-29 NOTE — ED Notes (Signed)
Patient alert, oriented calm cooperative.  Resting but c/o headache.  CIWA score 7.

## 2011-10-30 ENCOUNTER — Inpatient Hospital Stay (HOSPITAL_COMMUNITY)
Admission: EM | Admit: 2011-10-30 | Discharge: 2011-11-04 | DRG: 897 | Disposition: A | Discharge status: Home / Self Care | Payer: PRIVATE HEALTH INSURANCE | Source: Ambulatory Visit | Attending: Psychiatry | Admitting: Psychiatry

## 2011-10-30 ENCOUNTER — Encounter (HOSPITAL_COMMUNITY): Payer: Self-pay | Admitting: *Deleted

## 2011-10-30 DIAGNOSIS — F1020 Alcohol dependence, uncomplicated: Principal | ICD-10-CM

## 2011-10-30 DIAGNOSIS — F1021 Alcohol dependence, in remission: Secondary | ICD-10-CM

## 2011-10-30 DIAGNOSIS — F4310 Post-traumatic stress disorder, unspecified: Secondary | ICD-10-CM

## 2011-10-30 DIAGNOSIS — F39 Unspecified mood [affective] disorder: Secondary | ICD-10-CM

## 2011-10-30 DIAGNOSIS — F319 Bipolar disorder, unspecified: Secondary | ICD-10-CM

## 2011-10-30 DIAGNOSIS — F329 Major depressive disorder, single episode, unspecified: Secondary | ICD-10-CM

## 2011-10-30 DIAGNOSIS — E039 Hypothyroidism, unspecified: Secondary | ICD-10-CM

## 2011-10-30 DIAGNOSIS — F411 Generalized anxiety disorder: Secondary | ICD-10-CM

## 2011-10-30 DIAGNOSIS — Z8614 Personal history of Methicillin resistant Staphylococcus aureus infection: Secondary | ICD-10-CM

## 2011-10-30 DIAGNOSIS — Z79899 Other long term (current) drug therapy: Secondary | ICD-10-CM

## 2011-10-30 DIAGNOSIS — J45909 Unspecified asthma, uncomplicated: Secondary | ICD-10-CM

## 2011-10-30 DIAGNOSIS — M549 Dorsalgia, unspecified: Secondary | ICD-10-CM

## 2011-10-30 DIAGNOSIS — D649 Anemia, unspecified: Secondary | ICD-10-CM

## 2011-10-30 DIAGNOSIS — F1210 Cannabis abuse, uncomplicated: Secondary | ICD-10-CM

## 2011-10-30 DIAGNOSIS — E079 Disorder of thyroid, unspecified: Secondary | ICD-10-CM

## 2011-10-30 DIAGNOSIS — I10 Essential (primary) hypertension: Secondary | ICD-10-CM

## 2011-10-30 DIAGNOSIS — G43909 Migraine, unspecified, not intractable, without status migrainosus: Secondary | ICD-10-CM

## 2011-10-30 DIAGNOSIS — R569 Unspecified convulsions: Secondary | ICD-10-CM

## 2011-10-30 DIAGNOSIS — F17200 Nicotine dependence, unspecified, uncomplicated: Secondary | ICD-10-CM

## 2011-10-30 DIAGNOSIS — M545 Low back pain: Secondary | ICD-10-CM

## 2011-10-30 DIAGNOSIS — J309 Allergic rhinitis, unspecified: Secondary | ICD-10-CM

## 2011-10-30 MED ORDER — THIAMINE HCL 100 MG/ML IJ SOLN: 100.0000 mg | Freq: Once | INTRAMUSCULAR | Status: DC

## 2011-10-30 MED ORDER — HYDROXYZINE HCL 25 MG PO TABS
25.0000 mg | ORAL_TABLET | Freq: Four times a day (QID) | ORAL | Status: AC | PRN
  Administered 2011-10-31 – 2011-11-02 (×4): 25 mg via ORAL

## 2011-10-30 MED ORDER — CHLORDIAZEPOXIDE HCL 25 MG PO CAPS
25.0000 mg | ORAL_CAPSULE | Freq: Four times a day (QID) | ORAL | Status: AC
  Administered 2011-10-30 – 2011-10-31 (×5): 25 mg via ORAL
  Filled 2011-10-30 (×7): qty 1

## 2011-10-30 MED ORDER — ONDANSETRON HCL 4 MG PO TABS: 4.0000 mg | ORAL_TABLET | Freq: Three times a day (TID) | ORAL | Status: DC | PRN

## 2011-10-30 MED ORDER — MAGNESIUM HYDROXIDE 400 MG/5ML PO SUSP
30.0000 mL | Freq: Every day | ORAL | Status: DC | PRN
  Administered 2011-11-03: 30 mL via ORAL

## 2011-10-30 MED ORDER — NICOTINE 21 MG/24HR TD PT24
21.0000 mg | MEDICATED_PATCH | Freq: Every day | TRANSDERMAL | Status: DC
  Administered 2011-10-31 – 2011-11-04 (×5): 21 mg via TRANSDERMAL
  Filled 2011-10-30 (×7): qty 1

## 2011-10-30 MED ORDER — ONDANSETRON 4 MG PO TBDP: 4.0000 mg | ORAL_TABLET | Freq: Four times a day (QID) | ORAL | Status: AC | PRN

## 2011-10-30 MED ORDER — CHLORDIAZEPOXIDE HCL 25 MG PO CAPS
25.0000 mg | ORAL_CAPSULE | Freq: Three times a day (TID) | ORAL | Status: AC
  Administered 2011-11-01 (×3): 25 mg via ORAL
  Filled 2011-10-30 (×2): qty 1

## 2011-10-30 MED ORDER — CHLORDIAZEPOXIDE HCL 25 MG PO CAPS
25.0000 mg | ORAL_CAPSULE | Freq: Every day | ORAL | Status: AC
  Administered 2011-11-03: 25 mg via ORAL
  Filled 2011-10-30: qty 1

## 2011-10-30 MED ORDER — VITAMIN B-1 100 MG PO TABS
100.0000 mg | ORAL_TABLET | Freq: Every day | ORAL | Status: DC
  Administered 2011-10-31 – 2011-11-04 (×5): 100 mg via ORAL
  Filled 2011-10-30 (×7): qty 1

## 2011-10-30 MED ORDER — CHLORDIAZEPOXIDE HCL 25 MG PO CAPS
25.0000 mg | ORAL_CAPSULE | ORAL | Status: AC
  Administered 2011-11-02 (×2): 25 mg via ORAL
  Filled 2011-10-30 (×2): qty 1

## 2011-10-30 MED ORDER — ACETAMINOPHEN 325 MG PO TABS: 650.0000 mg | ORAL_TABLET | Freq: Four times a day (QID) | ORAL | Status: DC | PRN

## 2011-10-30 MED ORDER — ADULT MULTIVITAMIN W/MINERALS CH
1.0000 | ORAL_TABLET | Freq: Every day | ORAL | Status: DC
  Administered 2011-10-30 – 2011-11-04 (×6): 1 via ORAL
  Filled 2011-10-30 (×6): qty 1

## 2011-10-30 MED ORDER — ALUM & MAG HYDROXIDE-SIMETH 200-200-20 MG/5ML PO SUSP: 30.0000 mL | ORAL | Status: DC | PRN

## 2011-10-30 MED ORDER — LEVOTHYROXINE SODIUM 88 MCG PO TABS
88.0000 ug | ORAL_TABLET | Freq: Every day | ORAL | Status: DC
  Administered 2011-10-31 – 2011-11-04 (×5): 88 ug via ORAL
  Filled 2011-10-30: qty 14
  Filled 2011-10-30 (×6): qty 1

## 2011-10-30 MED ORDER — FOLIC ACID 1 MG PO TABS
1.0000 mg | ORAL_TABLET | Freq: Every day | ORAL | Status: DC
  Administered 2011-10-30 – 2011-11-04 (×6): 1 mg via ORAL
  Filled 2011-10-30 (×8): qty 1

## 2011-10-30 MED ORDER — LOPERAMIDE HCL 2 MG PO CAPS: 2.0000 mg | ORAL_CAPSULE | ORAL | Status: AC | PRN

## 2011-10-30 MED ORDER — IBUPROFEN 600 MG PO TABS
600.0000 mg | ORAL_TABLET | Freq: Three times a day (TID) | ORAL | Status: DC | PRN
  Administered 2011-11-01: 600 mg via ORAL
  Filled 2011-10-30: qty 1

## 2011-10-30 MED ORDER — CHLORDIAZEPOXIDE HCL 25 MG PO CAPS
50.0000 mg | ORAL_CAPSULE | Freq: Once | ORAL | Status: AC
  Administered 2011-10-30: 50 mg via ORAL
  Filled 2011-10-30: qty 2

## 2011-10-30 NOTE — ED Provider Notes (Signed)
Medical screening examination/treatment/procedure(s) were performed by non-physician practitioner and as supervising physician I was immediately available for consultation/collaboration.  Cyndra Numbers, MD 10/30/11 [phone]

## 2011-10-30 NOTE — Tx Team (Signed)
Initial Interdisciplinary Treatment Plan  PATIENT STRENGTHS: (choose at least two) General fund of knowledge Motivation for treatment/growth Supportive family/friends  PATIENT STRESSORS: Legal issue Substance abuse   PROBLEM LIST: Problem List/Patient Goals Date to be addressed Date deferred Reason deferred Estimated date of resolution  Substance Abuse: ETOH 10/30/11     Depression 10/30/11                                                DISCHARGE CRITERIA:  Ability to meet basic life and health needs Adequate post-discharge living arrangements Improved stabilization in mood, thinking, and/or behavior Motivation to continue treatment in a less acute level of care Safe-care adequate arrangements made Withdrawal symptoms are absent or subacute and managed without 24-hour nursing intervention  PRELIMINARY DISCHARGE PLAN: Attend aftercare/continuing care group Attend 12-step recovery group Return to previous living arrangement  PATIENT/FAMIILY INVOLVEMENT: This treatment plan has been presented to and reviewed with the patient, Kathleen Day.  The patient and family have been given the opportunity to ask questions and make suggestions.  [name] 10/30/2011, 7:37 PM

## 2011-10-30 NOTE — ED Notes (Signed)
Pt asking about black and white make up bag and purse, pt's belongings in locker 32, confirmed that only belongings are clothing.  Notified pt, pt states that she thought her sister had brought the other bags to her however after investigation no such documentation was found.

## 2011-10-30 NOTE — ED Notes (Signed)
Pt has been accepted at [facility] by Dr. Elmon Kirschner to attending Dr. Dan Humphreys. EDP has been notified.
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
## 2011-10-31 MED ORDER — NAPROXEN 250 MG PO TABS
250.0000 mg | ORAL_TABLET | Freq: Two times a day (BID) | ORAL | Status: DC
  Administered 2011-10-31 – 2011-11-04 (×9): 250 mg via ORAL
  Filled 2011-10-31: qty 28
  Filled 2011-10-31 (×9): qty 1
  Filled 2011-10-31: qty 28
  Filled 2011-10-31 (×2): qty 1

## 2011-10-31 MED ORDER — LEVOTHYROXINE SODIUM 88 MCG PO TABS: 88.0000 ug | ORAL_TABLET | Freq: Every day | ORAL | Status: DC

## 2011-10-31 MED ORDER — QUETIAPINE FUMARATE 200 MG PO TABS
200.0000 mg | ORAL_TABLET | Freq: Every day | ORAL | Status: DC
  Administered 2011-10-31: 200 mg via ORAL
  Filled 2011-10-31 (×2): qty 1

## 2011-10-31 MED ORDER — NAPROXEN SODIUM 220 MG PO TABS: 220.0000 mg | ORAL_TABLET | Freq: Two times a day (BID) | ORAL | Status: DC

## 2011-10-31 MED ORDER — GABAPENTIN 300 MG PO CAPS
300.0000 mg | ORAL_CAPSULE | Freq: Two times a day (BID) | ORAL | Status: DC
  Administered 2011-10-31 – 2011-11-01 (×3): 300 mg via ORAL
  Filled 2011-10-31 (×6): qty 1
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
## 2011-10-31 NOTE — Progress Notes (Signed)
[facility] Group Notes:  (Counselor/Nursing/MHT/Case Management/Adjunct)  10/31/2011 3:48 PM  Type of Therapy:  Group Therapy at 1:15  Participation Level:  Did Not Attend   Kathleen Day 10/31/2011, 3:48 PM

## 2011-10-31 NOTE — Progress Notes (Signed)
[facility] Group Notes:  (Counselor/Nursing/MHT/Case Management/Adjunct)  10/31/2011 12:50 PM  Type of Therapy:  Group Therapy  Participation Level:  Did Not Attend  Kathleen Day 10/31/2011, 12:50 PM

## 2011-10-31 NOTE — Progress Notes (Signed)
Pt has had limited interaction or participation in the milieu today, pt has expressed that she is still experiencing withdrawal symptoms, pt does have tremors that can be seen, pt has spoken about waking up with horrible dreams, pt spoke about chronic headaches that she experiences and going for management of pain, pt has received medications tonight without incident, support provided, will continue to monitor

## 2011-10-31 NOTE — H&P (Signed)
Psychiatric Admission Assessment Adult  Patient Identification:  Kathleen Day Date of Evaluation:  10/31/2011 Chief Complaint:  Alcohol Dependence History of Present Illness:Pt. Presents to [facility] requesting help detoxing from alcohol.  She states that she drinks 12-15 beers each day.  If she stops she "shakes real bad." Mood Symptoms: Pt. Denies symptoms of depression, but states she has significant withdrawal symptoms of nausea and shakes.  She also states a history of seizure disorder as well, but denies SI/HI.   PTSD Symptoms: none Past Psychiatric History: Diagnosis:  Hospitalizations: [facility], [facility], [facility]  Outpatient Care:  Substance Abuse Care:  Self-Mutilation:  Suicidal Attempts: none  Violent Behaviors:none   Past Medical History:   Past Medical History  Diagnosis Date  . Hypertension   . Alcohol abuse   . MRSA (methicillin resistant Staphylococcus aureus)   . Thyroid disease   . Bipolar 1 disorder   . Seizures     Last 10 years ago   Allergies:  NKDA PTA Medications: Prescriptions prior to admission  Medication Sig Dispense Refill  . diclofenac (VOLTAREN) 50 MG EC tablet Take 50 mg by mouth daily as needed. For back pain      . LEVOTHROID 88 MCG tablet TAKE 1 TABLET BY MOUTH ONCE DAILY  30 tablet  1  . gabapentin (NEURONTIN) 300 MG capsule Take 300 mg by mouth 4 (four) times daily. 1 capsule in the morning, 1 capsule at noon and 2 capsules at bed time      . naproxen sodium (ANAPROX) 220 MG tablet Take 220 mg by mouth 2 (two) times daily with a meal. pain      . QUEtiapine (SEROQUEL) 300 MG tablet Take 300 mg by mouth at bedtime.        Previous Psychotropic Medications:  [facility] Substance Abuse History in the last 12 months: Substance Age of 1st Use Last Use Amount Specific Type  Nicotine      Alcohol      Cannabis      Opiates      Cocaine      Methamphetamines      LSD      Ecstasy      Benzodiazepines      Caffeine      Inhalants       Others:                        Social History: Current Place of Residence:   Place of Birth:   Family Members: Marital Status:  Single Children:  Sons:  Daughters: Relationships: Education:   Educational Problems/Performance: ROS:  Nausea, shakes. PE:  Completed in ED. Pt. Given medical clearance. Findings reviewed and pt is evaluated. I agree with the findings. Mental Status Examination/Evaluation: Objective:  Appearance: Casual  Eye Contact::  Good  Speech:  Normal Rate  Volume:  Normal  Mood:  Anxious  Affect:  Appropriate  Thought Process:  Coherent  Orientation:  Full  Thought Content:  WDL  Suicidal Thoughts:  No  Homicidal Thoughts:  No  Memory:  Immediate;   Good  Judgement:  Intact  Insight:  Fair  Psychomotor Activity:  Normal  Concentration:  Good  Recall:  Good  Akathisia:  No  Handed:  Right  AIMS (if indicated):     Assets:  Desire for Improvement  Sleep:  Number of Hours: 1.25     Laboratory/X-Ray Psychological Evaluation(s)      Assessment:  AXIS I:  Alcohol Dependence & W/D; r/o PTSD & Mood Disorder NOS; Hx ADD on Adderal and BPAD; Hypothyroidism; HTN; Cannabis Abuse  AXIS II:  deferre AXIS III:   Past Medical History  Diagnosis Date  . Hypertension   . Alcohol abuse   . MRSA (methicillin resistant Staphylococcus aureus)   . Thyroid disease   . Bipolar 1 disorder   . Seizures     Last 10 years ago   AXIS IV:  economic problems, housing problems, problems related to social environment and problems with primary support group AXIS V:  51-60 moderate symptoms  Treatment Plan/Recommendations: Detox  Treatment Plan Summary: Daily contact with patient to assess and evaluate symptoms and progress in treatment Medication management Current Medications:  Current Facility-Administered Medications  Medication Dose Route Frequency Provider Last Rate Last Dose  . acetaminophen (TYLENOL) tablet 650 mg  650 mg Oral Q6H PRN Verne Spurr,  PA      . alum & mag hydroxide-simeth (MAALOX/MYLANTA) 200-200-20 MG/5ML suspension 30 mL  30 mL Oral Q4H PRN Verne Spurr, PA      . chlordiazePOXIDE (LIBRIUM) capsule 25 mg  25 mg Oral QID Verne Spurr, PA   25 mg at 10/31/11 0754   Followed by  . chlordiazePOXIDE (LIBRIUM) capsule 25 mg  25 mg Oral TID Verne Spurr, PA       Followed by  . chlordiazePOXIDE (LIBRIUM) capsule 25 mg  25 mg Oral BH-qamhs Verne Spurr, PA       Followed by  . chlordiazePOXIDE (LIBRIUM) capsule 25 mg  25 mg Oral Daily Verne Spurr, PA      . chlordiazePOXIDE (LIBRIUM) capsule 50 mg  50 mg Oral Once Verne Spurr, PA   50 mg at 10/30/11 1913  . folic acid (FOLVITE) tablet 1 mg  1 mg Oral Daily Verne Spurr, PA   1 mg at 10/31/11 1610  . hydrOXYzine (ATARAX/VISTARIL) tablet 25 mg  25 mg Oral Q6H PRN Verne Spurr, PA   25 mg at 10/31/11 9604  . ibuprofen (ADVIL,MOTRIN) tablet 600 mg  600 mg Oral Q8H PRN Verne Spurr, PA      . levothyroxine (SYNTHROID, LEVOTHROID) tablet 88 mcg  88 mcg Oral QAC breakfast Verne Spurr, [place]   88 mcg at 10/31/11 0646  . loperamide (IMODIUM) capsule 2-4 mg  2-4 mg Oral PRN Verne Spurr, PA      . magnesium hydroxide (MILK OF MAGNESIA) suspension 30 mL  30 mL Oral Daily PRN Verne Spurr, PA      . mulitivitamin with minerals tablet 1 tablet  1 tablet Oral Daily Verne Spurr, [place]   1 tablet at 10/31/11 0752  . nicotine (NICODERM CQ - dosed in mg/24 hours) patch 21 mg  21 mg Transdermal Daily Verne Spurr, PA   21 mg at 10/31/11 0755  . ondansetron (ZOFRAN) tablet 4 mg  4 mg Oral Q8H PRN Verne Spurr, PA      . ondansetron (ZOFRAN-ODT) disintegrating tablet 4 mg  4 mg Oral Q6H PRN Verne Spurr, PA      . thiamine (B-1) injection 100 mg  100 mg Intramuscular Once Verne Spurr, [place]      . thiamine (VITAMIN B-1) tablet 100 mg  100 mg Oral Daily Verne Spurr, PA   100 mg at 10/31/11 5409   Facility-Administered Medications Ordered in Other Encounters  Medication Dose Route  Frequency Provider Last Rate Last Dose  . DISCONTD: alum & mag hydroxide-simeth (MAALOX/MYLANTA) 200-200-20 MG/5ML suspension 30 mL  30 mL Oral PRN Shaaron Adler, PA-C      . DISCONTD: folic acid (FOLVITE) tablet 1 mg  1 mg Oral Daily Shaaron Adler, PA-C   1 mg at 10/30/11 0932  . DISCONTD: ibuprofen (ADVIL,MOTRIN) tablet 600 mg  600 mg Oral Q8H PRN Shaaron Adler, PA-C   600 mg at 10/29/11 1304  . DISCONTD: LORazepam (ATIVAN) injection 1 mg  1 mg Intravenous Q6H PRN Shaaron Adler, PA-C      . DISCONTD: LORazepam (ATIVAN) tablet 0-4 mg  0-4 mg Oral Q6H Shaaron Adler, PA-C   1 mg at 10/30/11 0700  . DISCONTD: LORazepam (ATIVAN) tablet 0-4 mg  0-4 mg Oral Q12H Stephanie Justice [place], [place]      . DISCONTD: LORazepam (ATIVAN) tablet 1 mg  1 mg Oral Q6H PRN Shaaron Adler, PA-C   1 mg at 10/30/11 1619  . DISCONTD: mulitivitamin with minerals tablet 1 tablet  1 tablet Oral Daily Shaaron Adler, PA-C   1 tablet at 10/30/11 0932  . DISCONTD: nicotine (NICODERM CQ - dosed in mg/24 hours) patch 21 mg  21 mg Transdermal Daily Shaaron Adler, PA-C   21 mg at 10/30/11 0932  . DISCONTD: ondansetron (ZOFRAN) tablet 4 mg  4 mg Oral Q8H PRN Shaaron Adler, PA-C      . DISCONTD: thiamine (B-1) injection 100 mg  100 mg Intravenous Daily Shaaron Adler, [place]      . DISCONTD: thiamine (VITAMIN B-1) tablet 100 mg  100 mg Oral Daily Shaaron Adler, PA-C   100 mg at 10/30/11 0932  . DISCONTD: zolpidem (AMBIEN) tablet 5 mg  5 mg Oral QHS PRN Shaaron Adler, PA-C        Observation Level/Precautions:  Detox  Laboratory:    Psychotherapy:    Medications:    Routine PRN Medications:  Yes  Consultations:    Discharge Concerns:    Other:     Mclean Moya 2/15/201311:44 AM
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
## 2011-10-31 NOTE — Progress Notes (Signed)
Checked on pt at 1415 to evaluate detox symptoms and headache. Pt asleep in bed. Respirations even and unlabored. Safety maintained.

## 2011-10-31 NOTE — Progress Notes (Signed)
C/O insomnia.  No withdrawal symptoms reported or observed.  Says very sad because her little dog died.  Denies SI or HI.  Support offered.  Medicated with Atarax at 01:56.

## 2011-10-31 NOTE — Progress Notes (Signed)
Pt detoxing off etoh, Librium protocol. Pt's appetite is poor, CIWA 6 for tremors and anxiety. Pt missing groups, pt tired this am. Pt denies SI/HI. 15 minute checks for safety.

## 2011-11-01 DIAGNOSIS — F1020 Alcohol dependence, uncomplicated: Principal | ICD-10-CM

## 2011-11-01 LAB — LITHIUM LEVEL: Lithium Lvl: <0.25 mEq/L — ABNORMAL LOW (ref 0.80–1.40)

## 2011-11-01 MED ORDER — GABAPENTIN 300 MG PO CAPS
300.0000 mg | ORAL_CAPSULE | Freq: Three times a day (TID) | ORAL | Status: DC
  Administered 2011-11-01 – 2011-11-04 (×13): 300 mg via ORAL
  Filled 2011-11-01 (×20): qty 1

## 2011-11-01 MED ORDER — QUETIAPINE FUMARATE 300 MG PO TABS
300.0000 mg | ORAL_TABLET | Freq: Every day | ORAL | Status: DC
  Administered 2011-11-01 – 2011-11-03 (×3): 300 mg via ORAL
  Filled 2011-11-01 (×5): qty 1
  Filled 2011-11-01: qty 14

## 2011-11-01 NOTE — Progress Notes (Signed)
Patient ID: Kathleen Day, female   DOB: [birth year], 51 y.o.   MRN: [id number] Pt. Eyes closed, no distress noted, resp. Even and unlabored. Staff will continue to monitor q61min for safety.

## 2011-11-01 NOTE — Progress Notes (Signed)
[facility] MD Progress Note  11/01/2011 9:55 AM  Diagnosis:   Axis I: See current hospital problem list Axis II: Deferred Axis III:  Past Medical History  Diagnosis Date  . Hypertension   . Alcohol abuse   . MRSA (methicillin resistant Staphylococcus aureus)   . Thyroid disease   . Bipolar 1 disorder   . Seizures     Last 10 years ago   Axis IV: Unchanged Axis V: 41-50 serious symptoms  ADL's:  Intact  Sleep: Fair  Appetite:  Fair  Suicidal Ideation:  Plan:  None Homicidal Ideation:  Plan:  None  AEB (as evidenced by):Kathleen Day presents extremely irritable and agitated regarding her medication dosing schedule.  She has hand tremors, but denies cravings.  Mental Status Examination/Evaluation: Objective:  Appearance: Disheveled  Eye Contact::  Good  Speech:  Clear and Coherent  Volume:  Normal  Mood:  Angry and Irritable  Affect:  Congruent  Thought Process:  Circumstantial and Linear  Orientation:  Full  Thought Content:  WDL  Suicidal Thoughts:  No  Homicidal Thoughts:  No  Memory:  Remote;   Good  Judgement:  Impaired  Insight:  Lacking  Psychomotor Activity:  Normal  Concentration:  Good  Recall:  Good  Akathisia:  No  Handed:    AIMS (if indicated):     Assets:  Communication Skills Resilience  Sleep:  Number of Hours: 6.25    Vital Signs:Blood pressure 134/78, pulse 82, temperature 96.5 F (35.8 C), temperature source Oral, resp. rate 16, height 5' 1.5" (1.562 m), weight 56.246 kg (124 lb). Current Medications: Current Facility-Administered Medications  Medication Dose Route Frequency Provider Last Rate Last Dose  . acetaminophen (TYLENOL) tablet 650 mg  650 mg Oral Q6H PRN Verne Spurr, PA      . alum & mag hydroxide-simeth (MAALOX/MYLANTA) 200-200-20 MG/5ML suspension 30 mL  30 mL Oral Q4H PRN Verne Spurr, PA      . chlordiazePOXIDE (LIBRIUM) capsule 25 mg  25 mg Oral QID Verne Spurr, PA   25 mg at 10/31/11 2150   Followed by  . chlordiazePOXIDE  (LIBRIUM) capsule 25 mg  25 mg Oral TID Verne Spurr, PA   25 mg at 11/01/11 2956   Followed by  . chlordiazePOXIDE (LIBRIUM) capsule 25 mg  25 mg Oral BH-qamhs Verne Spurr, PA       Followed by  . chlordiazePOXIDE (LIBRIUM) capsule 25 mg  25 mg Oral Daily Verne Spurr, PA      . folic acid (FOLVITE) tablet 1 mg  1 mg Oral Daily Verne Spurr, PA   1 mg at 11/01/11 0907  . gabapentin (NEURONTIN) capsule 300 mg  300 mg Oral TID WC & HS Jorje Guild, PA      . hydrOXYzine (ATARAX/VISTARIL) tablet 25 mg  25 mg Oral Q6H PRN Verne Spurr, PA   25 mg at 10/31/11 2130  . ibuprofen (ADVIL,MOTRIN) tablet 600 mg  600 mg Oral Q8H PRN Verne Spurr, PA   600 mg at 11/01/11 [id number]  . levothyroxine (SYNTHROID, LEVOTHROID) tablet 88 mcg  88 mcg Oral QAC breakfast Verne Spurr, [place]   88 mcg at 11/01/11 [id number]  . loperamide (IMODIUM) capsule 2-4 mg  2-4 mg Oral PRN Verne Spurr, PA      . magnesium hydroxide (MILK OF MAGNESIA) suspension 30 mL  30 mL Oral Daily PRN Verne Spurr, PA      . mulitivitamin with minerals tablet 1 tablet  1 tablet Oral Daily Verne Spurr, [place]  1 tablet at 11/01/11 0906  . naproxen (NAPROSYN) tablet 250 mg  250 mg Oral BID WC Orson Aloe, MD   250 mg at 11/01/11 0906  . nicotine (NICODERM CQ - dosed in mg/24 hours) patch 21 mg  21 mg Transdermal Daily Verne Spurr, PA   21 mg at 11/01/11 0907  . ondansetron (ZOFRAN-ODT) disintegrating tablet 4 mg  4 mg Oral Q6H PRN Verne Spurr, PA      . QUEtiapine (SEROQUEL) tablet 300 mg  300 mg Oral QHS Jorje Guild, PA      . thiamine (B-1) injection 100 mg  100 mg Intramuscular Once Verne Spurr, [place]      . thiamine (VITAMIN B-1) tablet 100 mg  100 mg Oral Daily Verne Spurr, PA   100 mg at 11/01/11 0932  . DISCONTD: gabapentin (NEURONTIN) capsule 300 mg  300 mg Oral BID Verne Spurr, PA   300 mg at 11/01/11 0931  . DISCONTD: levothyroxine (SYNTHROID, LEVOTHROID) tablet 88 mcg  88 mcg Oral QAC breakfast Verne Spurr, [place]      . DISCONTD:  naproxen sodium (ANAPROX) tablet 220 mg  220 mg Oral BID WC Verne Spurr, PA      . DISCONTD: ondansetron (ZOFRAN) tablet 4 mg  4 mg Oral Q8H PRN Verne Spurr, PA      . DISCONTD: QUEtiapine (SEROQUEL) tablet 200 mg  200 mg Oral QHS Verne Spurr, PA   200 mg at 10/31/11 2150    Lab Results: No results found for this or any previous visit (from the past 48 hour(s)).  Physical Findings: AIMS:  , ,  ,  ,    CIWA:  CIWA-Ar Total: 10  COWS:     Treatment Plan Summary: Daily contact with patient to assess and evaluate symptoms and progress in treatment Medication management  Plan: Increase Neurontin to qid.  Increase Seroquel to 300 mg qhs.  Check Lithium level.  Consider resuming Lithium.  Continue safe medical detox, and research [facility] treatment options.  Kathleen Day 11/01/2011, 9:55 AM

## 2011-11-01 NOTE — Progress Notes (Signed)
Pt is out in milieu but observably anxious. Has multiple physical complaints r/t to withdrawal and scheduled as well as prn medications given. Rates depression at 6 and hopelessness at 7.  Also reports poor sleep and improving appetite.  Missed afternoon group due to "dizziness". Support and encouragment given and 15' checks cont for safety.

## 2011-11-01 NOTE — Progress Notes (Signed)
Pt brightens on approach, in room reading book.  Positive for group, interacting appropriately with peers on unit.  Denies SI/HI/hallucinations.  No complaints voiced.  Support and encouragement offered, will continue to monitor.

## 2011-11-01 NOTE — Progress Notes (Signed)
Patient ID: Kathleen Day, female   DOB: 16-Dec-1960, 51 y.o.   MRN: [id number]  [facility] Group Notes:  (Counselor/Nursing/MHT/Case Management/Adjunct)  11/01/2011 1:15 PM  Type of Therapy:  Group Therapy, Dance/Movement Therapy   Participation Level:  Did Not Attend  Kym Groom

## 2011-11-02 MED ORDER — INFLUENZA VIRUS VACC SPLIT PF IM SUSP
0.5000 mL | INTRAMUSCULAR | Status: AC
  Administered 2011-11-03: 0.5 mL via INTRAMUSCULAR

## 2011-11-02 MED ORDER — PNEUMOCOCCAL VAC POLYVALENT 25 MCG/0.5ML IJ INJ
0.5000 mL | INJECTION | INTRAMUSCULAR | Status: AC
  Administered 2011-11-03: 0.5 mL via INTRAMUSCULAR

## 2011-11-02 NOTE — Progress Notes (Signed)
[facility] MD Progress Note  11/02/2011 9:02 AM  Diagnosis:   Axis I: See current hospital problem list Axis II: Deferred Axis III:  Past Medical History  Diagnosis Date  . Hypertension   . Alcohol abuse   . MRSA (methicillin resistant Staphylococcus aureus)   . Thyroid disease   . Bipolar 1 disorder   . Seizures     Last 10 years ago   Axis IV: Unchanged Axis V: 41-50 serious symptoms  ADL's:  Intact  Sleep: Good  Appetite:  Good  Suicidal Ideation:  Plan:  None Homicidal Ideation:  Plan:  None  AEB (as evidenced by):Kathleen Day slept better last night and her mood and anxiety have improved.  She ate a good breakfast this am.  She asked about resuming her Lithium, but when questioned, stated she was uncertain if it really helped in the past.  She denies andy w/d or cravings.  She is interested in going to [facility] for treatment.    Mental Status Examination/Evaluation: Objective:  Appearance: Casual and Well Groomed  Eye Contact::  Good  Speech:  Clear and Coherent and Normal Rate  Volume:  Normal  Mood:  Euthymic  Affect:  Appropriate  Thought Process:  Circumstantial and Logical  Orientation:  Full  Thought Content:  WDL  Suicidal Thoughts:  No  Homicidal Thoughts:  No  Memory:  Remote;   Good  Judgement:  Fair  Insight:  Fair  Psychomotor Activity:  Normal  Concentration:  Good  Recall:  Good  Akathisia:  No  Handed:    AIMS (if indicated):     Assets:  Desire for Improvement Resilience  Sleep:  Number of Hours: 6.25    Vital Signs:Blood pressure 119/84, pulse 74, temperature 98 F (36.7 C), temperature source Oral, resp. rate 79, height 5' 1.5" (1.562 m), weight 56.246 kg (124 lb). Current Medications: Current Facility-Administered Medications  Medication Dose Route Frequency Provider Last Rate Last Dose  . acetaminophen (TYLENOL) tablet 650 mg  650 mg Oral Q6H PRN Verne Spurr, PA      . alum & mag hydroxide-simeth (MAALOX/MYLANTA) 200-200-20 MG/5ML  suspension 30 mL  30 mL Oral Q4H PRN Verne Spurr, PA      . chlordiazePOXIDE (LIBRIUM) capsule 25 mg  25 mg Oral TID Verne Spurr, PA   25 mg at 11/01/11 1714   Followed by  . chlordiazePOXIDE (LIBRIUM) capsule 25 mg  25 mg Oral BH-qamhs Verne Spurr, PA       Followed by  . chlordiazePOXIDE (LIBRIUM) capsule 25 mg  25 mg Oral Daily Verne Spurr, [place]      . folic acid (FOLVITE) tablet 1 mg  1 mg Oral Daily Verne Spurr, PA   1 mg at 11/02/11 0756  . gabapentin (NEURONTIN) capsule 300 mg  300 mg Oral TID WC & HS Jorje Guild, PA   300 mg at 11/02/11 0757  . hydrOXYzine (ATARAX/VISTARIL) tablet 25 mg  25 mg Oral Q6H PRN Verne Spurr, PA   25 mg at 11/01/11 2221  . ibuprofen (ADVIL,MOTRIN) tablet 600 mg  600 mg Oral Q8H PRN Verne Spurr, PA   600 mg at 11/01/11 [phone]  . levothyroxine (SYNTHROID, LEVOTHROID) tablet 88 mcg  88 mcg Oral QAC breakfast Verne Spurr, [place]   88 mcg at 11/02/11 0604  . loperamide (IMODIUM) capsule 2-4 mg  2-4 mg Oral PRN Verne Spurr, PA      . magnesium hydroxide (MILK OF MAGNESIA) suspension 30 mL  30 mL Oral Daily PRN  Verne Spurr, PA      . mulitivitamin with minerals tablet 1 tablet  1 tablet Oral Daily Verne Spurr, [place]   1 tablet at 11/02/11 0757  . naproxen (NAPROSYN) tablet 250 mg  250 mg Oral BID WC Orson Aloe, MD   250 mg at 11/02/11 0757  . nicotine (NICODERM CQ - dosed in mg/24 hours) patch 21 mg  21 mg Transdermal Daily Verne Spurr, PA   21 mg at 11/02/11 0759  . ondansetron (ZOFRAN-ODT) disintegrating tablet 4 mg  4 mg Oral Q6H PRN Verne Spurr, PA      . QUEtiapine (SEROQUEL) tablet 300 mg  300 mg Oral QHS Jorje Guild, PA   300 mg at 11/01/11 2220  . thiamine (B-1) injection 100 mg  100 mg Intramuscular Once Verne Spurr, [place]      . thiamine (VITAMIN B-1) tablet 100 mg  100 mg Oral Daily Verne Spurr, PA   100 mg at 11/02/11 0757  . DISCONTD: gabapentin (NEURONTIN) capsule 300 mg  300 mg Oral BID Verne Spurr, PA   300 mg at 11/01/11 0931  .  DISCONTD: QUEtiapine (SEROQUEL) tablet 200 mg  200 mg Oral QHS Verne Spurr, PA   200 mg at 10/31/11 2150    Lab Results:  Results for orders placed during the hospital encounter of 10/30/11 (from the past 48 hour(s))  LITHIUM LEVEL     Status: Abnormal   Collection Time   11/01/11  7:40 PM      Component Value Range Comment   Lithium Lvl <0.25 (*) 0.80 - 1.40 (mEq/L)     Physical Findings: AIMS:  , ,  ,  ,    CIWA:  CIWA-Ar Total: 5  COWS:     Treatment Plan Summary: Daily contact with patient to assess and evaluate symptoms and progress in treatment Medication management  Plan: Continue current treatment plan Refer to [facility] for residential SA treatment if bed available.  Kathleen Day 11/02/2011, 9:02 AM

## 2011-11-02 NOTE — Progress Notes (Signed)
Patient ID: Kathleen Day, female   DOB: 12-01-1960, 51 y.o.   MRN: [id number]  [facility] Group Notes:  (Counselor/Nursing/MHT/Case Management/Adjunct)  11/02/2011 1:15 PM  Type of Therapy:  Group Therapy, Dance/Movement Therapy   Participation Level:  Active  Participation Quality:  Appropriate  Affect:  Appropriate  Cognitive:  Alert  Insight:  Good  Engagement in Group:  Good  Engagement in Therapy:  Good  Modes of Intervention:  Clarification, Problem-solving, Role-play, Socialization and Support  Summary of Progress/Problems:  Therapist discussed with group what happens when a plan doesn't go as expected and  ways to develop positive coping mechanisms when support systems have been exhausted in order to remain focused.  Pt. stated that she will " read my favorite bible verses ".        Rhunette Croft

## 2011-11-02 NOTE — Progress Notes (Signed)
Pt is out in milieu interacting with peers and attending groups.  C/O agitation r/t withdrawal but behavior does not match stated mood.  Denies SI.  D/C goals are to "not drink and be more positive".  Rates depression at 3 and hopelessness at 4.  Also reports fair sleep,improving appetite and low energy.  Support and encouragement given. 15' checks cont for safety.

## 2011-11-02 NOTE — Progress Notes (Signed)
D:  Pt said she was missing her cell phone and her family did not have it.  Unlocked locker with security, no cell phone in locker.  A:  Informed pt that cell phone was not in locker.  Research and gave pt number she needed from phonebook.  Reported findings to RN.  R:  Pt is safe. Aundria Rud, Tahiry Spicer L, MHT/NS

## 2011-11-03 MED ORDER — CHLORDIAZEPOXIDE HCL 25 MG PO CAPS
25.0000 mg | ORAL_CAPSULE | Freq: Two times a day (BID) | ORAL | Status: DC | PRN
Start: 2011-11-03 — End: 2011-11-04
  Administered 2011-11-03 – 2011-11-04 (×3): 25 mg via ORAL
  Filled 2011-11-03 (×3): qty 1

## 2011-11-03 NOTE — Progress Notes (Signed)
Pt up and positive for group, interacting appropriately with peers on unit.  Denies complaints, denies SI/HI/hallucinations.  Read below notation recorded by MHT.  Pt did not mention phone incident to this Clinical research associate.  Support and encouragement offered, will continue to monitor.
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
## 2011-11-03 NOTE — Progress Notes (Signed)
Patient ID: Kathleen Day, female   DOB: 1961-01-20, 51 y.o.   MRN: [id number] Pt reports poor sleep and good appetite.  She rates her depression an 8 and her hopelessness a 7.  She is still reporting chilling and tremors.  She denies SI/HI.  Pt is requesting flu and pneumonia shots and Plan to give these today.  Pt is attending groups.  Talked with her about smoking cessation and gave her [place] Quitline information.

## 2011-11-03 NOTE — Progress Notes (Signed)
[facility] MD Progress Note  11/03/2011 11:30 AM  S/O:  Patient seen and evaluated. Chart reviewed. Patient stated that her mood was "better". Her affect was mood congruent, yet anxious. She denied any current thoughts of self injurious behavior, suicidal ideation or homicidal ideation. There were no auditory or visual hallucinations, paranoia, delusional thought processes, or mania noted. Thought process was linear and goal directed. No psychomotor agitation or retardation was noted. Speech was normal rate, tone and volume. Eye contact was good. Judgment and insight are fair. No acute safety concerns reported from team.  Participating in group. Sleep better with Seroquel.  Pt not interested in restarting lithium at this time, will follow mood stability through and s/p detox.   Sleep:  Number of Hours: 5.25    Vital Signs:Blood pressure 130/86, pulse 92, temperature 97.4 F (36.3 C), temperature source Oral, resp. rate 16, height 5' 1.5" (1.562 m), weight 56.246 kg (124 lb).  Current Medications:    . chlordiazePOXIDE  25 mg Oral BH-qamhs   Followed by  . chlordiazePOXIDE  25 mg Oral Daily  . folic acid  1 mg Oral Daily  . gabapentin  300 mg Oral TID WC & HS  . influenza  inactive virus vaccine  0.5 mL Intramuscular Tomorrow-1000   And  . pneumococcal 23 valent vaccine  0.5 mL Intramuscular Tomorrow-1000  . levothyroxine  88 mcg Oral QAC breakfast  . mulitivitamin with minerals  1 tablet Oral Daily  . naproxen  250 mg Oral BID WC  . nicotine  21 mg Transdermal Daily  . QUEtiapine  300 mg Oral QHS  . thiamine  100 mg Intramuscular Once  . thiamine  100 mg Oral Daily    Lab Results:  Results for orders placed during the hospital encounter of 10/30/11 (from the past 48 hour(s))  LITHIUM LEVEL     Status: Abnormal   Collection Time   11/01/11  7:40 PM      Component Value Range Comment   Lithium Lvl <0.25 (*) 0.80 - 1.40 (mEq/L)     Physical Findings: CIWA:  CIWA-Ar Total: 5   A/P:  Alcohol Dependence & W/D; r/o PTSD & Mood Disorder NOS; Hx ADD on Adderal and BPAD; Hypothyroidism; HTN; Cannabis Abuse   Treatment goals and medication management reviewed with patient.  Continue current treatment plan and medications.  No SEs reported at current dosages.  No acute medical or psychiatric issues noted. Pt agreeable with plan.  Discussed with team.  Outpt f/u: Dr. Ladona Ridgel at [facility].  Interested in [facility] residential Tx program.  Will see in treatment team in am.  Lupe Carney 11/03/2011, 11:30 AM

## 2011-11-03 NOTE — Progress Notes (Signed)
Pt attended discharge planning group and actively participated.  Pt presents with anxious mood and affect.  Pt denies depression but ranks anxiety at a 7 today.  Pt denies SI/HI.  Pt reports still feeling jittery, detoxing off of alcohol.  Pt will follow up at [facility] next Monday for further substance abuse treatment.  Pt states that she is concerned with her d/c plan, regarding if she will d/c before going straight to treatment.  Pt states that her boyfriend she lives with continues to drink and this will be high risk for her to relapse.  Pt states that she doesn't have any other family support or other places to go until Monday.  SW encouraged pt to continue to think of alternative plans in the meantime.  Safety planning and suicide prevention discussed.     Reyes Ivan, LCSWA 11/03/2011  11:35 AM

## 2011-11-03 NOTE — Progress Notes (Signed)
[facility] Group Notes:  (Counselor/Nursing/MHT/Case Management/Adjunct)  11/03/2011 3:12 PM  Type of Therapy:  Group Therapy at 11:00  Participation Level:  Active  Participation Quality:  Attentive and Sharing  Affect:  Appropriate  Cognitive:  Alert and Oriented  Insight:  Limited  Engagement in Group:  Good  Engagement in Therapy:  Limited  Modes of Intervention:  Clarification, Socialization and Support  Summary of Progress/Problems:  Kathleen Day shared during group that her goal for the next year is to again live alone in her own home whether it is a rental or purchase, improve her relationship with her children so she can again spend time with her grandson, and improve her self esteem by changing her behaviors.  Kathleen Day shared that these things are all doable in less than 24 months and her biggest obstacle to completion would be her addiction. "The biggest obstacle to my staying clean is the people and places I surround myself with and my own negative thinking."   Clide Dales 11/03/2011, 3:12 PM  [facility] Group Notes:  (Counselor/Nursing/MHT/Case Management/Adjunct)  11/03/2011 3:21 PM  Type of Therapy:  Group Therapy at 1:15  Participation Level:  Active  Participation Quality:  Attentive and Sharing  Affect:  Appropriate  Cognitive:  Alert and Oriented  Insight:  Limited  Engagement in Group:  Good  Engagement in Therapy:  Limited  Modes of Intervention:  Education  Summary of Progress/Problems:  Kathleen Day was attentive to presentation by [facility] and asked appropriate questions in regard to available support groups.    Clide Dales 11/03/2011, 3:24 PM

## 2011-11-04 DIAGNOSIS — E039 Hypothyroidism, unspecified: Secondary | ICD-10-CM

## 2011-11-04 DIAGNOSIS — F1021 Alcohol dependence, in remission: Secondary | ICD-10-CM

## 2011-11-04 MED ORDER — NICOTINE 21 MG/24HR TD PT24: MEDICATED_PATCH | TRANSDERMAL | Status: DC

## 2011-11-04 MED ORDER — QUETIAPINE FUMARATE 25 MG PO TABS: 25.0000 mg | ORAL_TABLET | Freq: Two times a day (BID) | ORAL | Status: DC

## 2011-11-04 MED ORDER — QUETIAPINE FUMARATE 300 MG PO TABS: 300.0000 mg | ORAL_TABLET | Freq: Every day | ORAL | Status: DC

## 2011-11-04 MED ORDER — GABAPENTIN 300 MG PO CAPS: 300.0000 mg | ORAL_CAPSULE | Freq: Three times a day (TID) | ORAL | Status: DC

## 2011-11-04 MED ORDER — GABAPENTIN 600 MG PO TABS
300.0000 mg | ORAL_TABLET | Freq: Three times a day (TID) | ORAL | Status: DC
  Filled 2011-11-04 (×4): qty 28

## 2011-11-04 MED ORDER — NAPROXEN 250 MG PO TABS: 250.0000 mg | ORAL_TABLET | Freq: Two times a day (BID) | ORAL | Status: DC

## 2011-11-04 MED ORDER — QUETIAPINE FUMARATE 25 MG PO TABS
25.0000 mg | ORAL_TABLET | Freq: Two times a day (BID) | ORAL | Status: DC
  Filled 2011-11-04: qty 28
  Filled 2011-11-04 (×2): qty 1
  Filled 2011-11-04: qty 28

## 2011-11-04 NOTE — Progress Notes (Signed)
[facility] Case Management Discharge Plan:  Will you be returning to the same living situation after discharge: No. At discharge, do you have transportation home?:Yes,  sister Do you have the ability to pay for your medications:Yes,  Kathleen Day states she is able to afford meds  Interagency Information:     Release of information consent forms completed and in the chart;  Patient's signature needed at discharge.  Patient to Follow up at:  Follow-up Information    Follow up with [facility]  on 11/10/2011. (Arrive there at 8:00 AM!!)    Contact information:   [address] [place], [place] [address] [phone]         Patient denies SI/HI:   Yes,  yes    Safety Planning and Suicide Prevention discussed:  Yes,  yes  Barrier to discharge identified:No.  Summary and Recommendations:   Kathleen Day 11/04/2011, 11:29 AM

## 2011-11-04 NOTE — Discharge Summary (Signed)
Physician Discharge Summary Note  Patient:  Kathleen Day is an 51 y.o., female MRN:  [id number] DOB:  [birth year] Patient phone:  [phone] (home)  Patient address:   [address] [place] [address],   Date of Admission:  10/30/2011 Date of Discharge: 11/04/2011  Reason for Admission: Detox  Discharge Diagnoses: Active Problems:  * No active hospital problems. *  Axis Diagnosis:   AXIS I:  Alcohol Dependence; PTSD & Mood Disorder NOS; Hypothyroidism; HTN; Cannabis Abuse  AXIS II:  Deferred AXIS III:   Past Medical History  Diagnosis Date  . Hypertension   . Alcohol abuse   . MRSA (methicillin resistant Staphylococcus aureus)   . Thyroid disease   . Bipolar 1 disorder   . Seizures     Last 10 years ago   AXIS IV:  economic problems and occupational problems AXIS V:  51-60 moderate symptoms  Level of Care:  Long-term IP psych. HPI:Pt. Presents to [facility] requesting help detoxing from alcohol. She states that she drinks 12-15 beers each day. If she stops she "shakes real bad."  Hospital Course:  Kathleen Day was admitted for alcohol detox to [facility]. She did well and worked with the CM, SW, PA, MD, toward a goal of residential rehab upon her discharge.  Kathleen Day responded well to the Librium protocol. Her chronic health issues were assessed and treated appropriately.  She participated in unit programming and AA meetings. On the day of discharge Kathleen Day requested to go home to her sister's home and had a plan to follow up with [facility] as scheduled.  She met with the treatment team and MD and all agreed her plan was an appropriate one and that she was safe to discharge.  Consults:  None  Significant Diagnostic Studies:  None  Discharge Vitals:   Blood pressure 115/85, pulse 85, temperature 97.3 F (36.3 C), temperature source Oral, resp. rate 16, height 5' 1.5" (1.562 m), weight 56.246 kg (124 lb).  Mental Status Exam: See Mental Status Examination and Suicide Risk  Assessment completed by Attending Physician prior to discharge.  Discharge destination:  Home and then to [facility].  Is patient on multiple antipsychotic therapies at discharge:  No   Has Patient had three or more failed trials of antipsychotic monotherapy by history:  No  Recommended Plan for Multiple Antipsychotic Therapies: N/A Discharge Orders    Future Orders Please Complete By Expires   Diet - low sodium heart healthy      Increase activity slowly      Discharge instructions      Comments:   Take all medication as prescribed.  Follow up with [facility] as planned.  Keep all scheduled appointments.  90 meetings in 90 days.     Medication List  As of 11/04/2011 11:17 AM   TAKE these medications      Indication    diclofenac 50 MG EC tablet   Commonly known as: VOLTAREN   Take 50 mg by mouth daily as needed. For back pain       gabapentin 300 MG capsule   Commonly known as: NEURONTIN   Take 300 mg by mouth 4 (four) times daily. 1 capsule in the morning, 1 capsule at noon and 2 capsules at bed time       LEVOTHROID 88 MCG tablet   Generic drug: levothyroxine   TAKE 1 TABLET BY MOUTH ONCE DAILY       naproxen sodium 220 MG tablet   Commonly known as: ANAPROX  Take 220 mg by mouth 2 (two) times daily with a meal. pain       nicotine 21 mg/24hr patch   Commonly known as: NICODERM CQ - dosed in mg/24 hours   For smoking cessation.       QUEtiapine 300 MG tablet   Commonly known as: SEROQUEL   Take 1 tablet (300 mg total) by mouth at bedtime.       QUEtiapine 25 MG tablet   Commonly known as: SEROQUEL   Take 1 tablet (25 mg total) by mouth 2 (two) times daily. For anxiety.            Follow-up Information    Follow up with [facility]  on 11/10/2011. (Arrive there at 8:00 AM!!)    Contact information:   [address]. [place], [place] [address] [phone]         Follow-up recommendations:  Keep your follow up appointment at [facility]  residential!  Comments:  Continue a heart healthy diet, get regular exercise, and continue to participate in a supportive community for sobriety.  Signed: Rona Ravens. Jahshua Bonito PAC For Dr. Lupe Carney 11/04/2011, 11:17 AM

## 2011-11-04 NOTE — Progress Notes (Signed)
[facility] Group Notes:  (Counselor/Nursing/MHT/Case Management/Adjunct)  11/04/2011   Type of Therapy:  Group Therapy AT 11:00  Participation Level:  Active  Participation Quality:  Attentive, Monopolizing and Sharing  Affect:  Anxious  Cognitive:  Alert and Oriented  Insight:  Limited  Engagement in Group:  Good  Engagement in Therapy:  Limited  Modes of Intervention:  Clarification, Limit-setting and Support  Summary of Progress/Problems:  Danella Deis shared that she has experienced feeling anxious, angry and disappointed today.  She also shared that she has similar feelings directed toward her addiction disease; "somewhat baffled that it is a disease; somewhat anxious that she cannot get over it and disappointed with my own behavior in addition to anger.  Why do I have to be the one that gets this?"   Clide Dales 11/04/2011, 2:54 PM

## 2011-11-04 NOTE — Progress Notes (Signed)
Pt. To be discharged-states she will stay with her sisiter-also states she has not had a BM since the 14th. Medical team aware and pt.will continue to drink prune juice and push fluids. No SI or HI and contracts for safety.

## 2011-11-04 NOTE — Treatment Plan (Signed)
Interdisciplinary Treatment Plan Update (Adult)  Date: 11/04/2011  Time Reviewed: 10:05 AM   Progress in Treatment: Attending groups: Yes Participating in groups: Yes Taking medication as prescribed: Yes Tolerating medication: Yes   Family/Significant othe contact made: None Patient understands diagnosis:  Yes  As evidenced by asking for help with substance abuse and anxiety Discussing patient identified problems/goals with staff:  Yes  See below Medical problems stabilized or resolved:  Yes Denies suicidal/homicidal ideation: Yes  In tx team Issues/concerns per patient self-inventory:  Yes  On-going anxiety  Not getting meds as prescribed Other:  New problem(s) identified: N/A  Reason for Continuation of Hospitalization: Anxiety  Interventions implemented related to continuation of hospitalization:   Additional comments:  Estimated length of stay:D/C today  Discharge Plan:Stay with sister,  Go to [facility] rehab on Monday New goal(s): N/A  Review of initial/current patient goals per problem list:    1.  Goal(s):Safely detox from alcohol  Met:  Yes  Target date:2/19  As evidenced ZO:[id number] in CIWA score to 0  2.  Goal (s):Decrease anxiety  Met:  Yes  Target date:2/19  As evidenced by:Kim will report a decrease in anxiety  3.  Goal(s):  Met:  Yes  Target date:  As evidenced by:  4.  Goal(s):  Met:  Yes  Target date:  As evidenced by:  Attendees: Patient:  Kathleen Day 11/04/2011 10:05AM   Family:     Physician:  Lupe Carney 11/04/2011 10:05 AM   Nursing:    11/04/2011 10:05 AM   Case Manager:  Richelle Ito, LCSW 11/04/2011 10:05 AM   Counselor:  Ronda Fairly, LCSWA 11/04/2011 10:05 AM   Other:     Other:     Other:     Other:      Scribe for Treatment Team:   Ida Rogue, 11/04/2011 10:05 AM

## 2011-11-04 NOTE — Progress Notes (Signed)
Pt. States she was feeliing very anxious. Given 25mg  of librium po. Presently pt  Is in the dayroom participating in dog therapy with the other pts. Appears in good spirits.
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
## 2011-11-04 NOTE — Progress Notes (Signed)
Pt d/c from hospital with sister. All items returned. D/c instructions given, prescriptions given and samples given. Pt denies si and hi.

## 2011-11-04 NOTE — Progress Notes (Signed)
Pt. Is very talkative this am. States she needs the MD to order her some anxiety medications. Pt. States she had a good sleep and contracts for safety. Her goals are to stop drinking and to get back to work. Her main concern this am is to be put on other medication. Information to be relayed to MD. Pt. Presently is attending group and denies SI or Hi.

## 2011-11-07 NOTE — Progress Notes (Signed)
Patient Discharge Instructions:  Admission Note Faxed,  11/07/2011 After Visit Summary Faxed,  11/07/2011 Faxed to the Next Level Care provider:  11/07/2011 D/C Summary Note faxed 11/07/2011 Facesheet faxed 11/07/2011  Faxed to [facility] @ [phone]  Wandra Scot, 11/07/2011, 4:30 PM

## 2011-11-17 ENCOUNTER — Other Ambulatory Visit: Payer: Self-pay | Admitting: Internal Medicine

## 2011-11-25 ENCOUNTER — Other Ambulatory Visit: Payer: Self-pay | Admitting: *Deleted

## 2011-11-25 MED ORDER — LEVOTHYROXINE SODIUM 88 MCG PO TABS: 88.0000 ug | ORAL_TABLET | Freq: Every day | ORAL | Status: DC

## 2012-03-10 ENCOUNTER — Other Ambulatory Visit: Payer: Self-pay | Admitting: Internal Medicine

## 2012-07-27 ENCOUNTER — Other Ambulatory Visit: Payer: Self-pay | Admitting: Internal Medicine

## 2012-07-27 DIAGNOSIS — Z1231 Encounter for screening mammogram for malignant neoplasm of breast: Secondary | ICD-10-CM

## 2012-07-29 ENCOUNTER — Encounter: Payer: Self-pay | Admitting: Internal Medicine

## 2012-08-02 ENCOUNTER — Ambulatory Visit (HOSPITAL_BASED_OUTPATIENT_CLINIC_OR_DEPARTMENT_OTHER): Payer: Self-pay

## 2012-08-02 ENCOUNTER — Encounter: Payer: Self-pay | Admitting: Internal Medicine

## 2012-08-02 ENCOUNTER — Other Ambulatory Visit (INDEPENDENT_AMBULATORY_CARE_PROVIDER_SITE_OTHER): Payer: Medicare Other

## 2012-08-02 ENCOUNTER — Ambulatory Visit (INDEPENDENT_AMBULATORY_CARE_PROVIDER_SITE_OTHER): Payer: Medicare Other | Admitting: Internal Medicine

## 2012-08-02 VITALS — BP 134/82 | HR 86 | Temp 97.4°F | Ht 62.0 in | Wt 145.0 lb

## 2012-08-02 DIAGNOSIS — F319 Bipolar disorder, unspecified: Secondary | ICD-10-CM

## 2012-08-02 DIAGNOSIS — Z79899 Other long term (current) drug therapy: Secondary | ICD-10-CM

## 2012-08-02 DIAGNOSIS — E039 Hypothyroidism, unspecified: Secondary | ICD-10-CM

## 2012-08-02 DIAGNOSIS — Z0000 Encounter for general adult medical examination without abnormal findings: Secondary | ICD-10-CM

## 2012-08-02 DIAGNOSIS — M545 Low back pain, unspecified: Secondary | ICD-10-CM

## 2012-08-02 LAB — CBC WITH DIFFERENTIAL/PLATELET
Basophils Relative: 0.5 % (ref 0.0–3.0)
Eosinophils Relative: 4.7 % (ref 0.0–5.0)
HCT: 33.1 % — ABNORMAL LOW (ref 36.0–46.0)
Lymphs Abs: 2.7 10*3/uL (ref 0.7–4.0)
MCV: 88.5 fl (ref 78.0–100.0)
Monocytes Absolute: 1 10*3/uL (ref 0.1–1.0)
RBC: 3.74 Mil/uL — ABNORMAL LOW (ref 3.87–5.11)
WBC: 8.3 10*3/uL (ref 4.5–10.5)

## 2012-08-02 LAB — LIPID PANEL
Cholesterol: 197 mg/dL (ref 0–200)
LDL Cholesterol: 120 mg/dL — ABNORMAL HIGH (ref 0–99)
Triglycerides: 74 mg/dL (ref 0.0–149.0)
VLDL: 14.8 mg/dL (ref 0.0–40.0)

## 2012-08-02 LAB — HEPATIC FUNCTION PANEL
Albumin: 4.3 g/dL (ref 3.5–5.2)
Bilirubin, Direct: 0 mg/dL (ref 0.0–0.3)
Total Protein: 7.2 g/dL (ref 6.0–8.3)

## 2012-08-02 LAB — BASIC METABOLIC PANEL
Chloride: 104 mEq/L (ref 96–112)
Potassium: 3.9 mEq/L (ref 3.5–5.1)
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
## 2012-08-02 NOTE — Patient Instructions (Addendum)
It was good to see you today. We have reviewed your prior records including labs and tests today Health Maintenance reviewed - all recommended immunizations and age-appropriate screenings are up-to-date. Test(s) ordered today. Your results will be released to MyChart (or called to you) after review, usually within 72hours after test completion. If any changes need to be made, you will be notified at that same time. Medications reviewed, no changes at this time. Refill on medication(s) as discussed today. we'll make referral to physical therapy in [facility] (before referral to back or pain specialist). Our office will contact you regarding appointment(s) once made. We will complete your form for [employer] as requested and call you when this is done We will send copy of your labs to dr Ladona Ridgel at [facility] as requested after review Please schedule followup in 6 months for thyroid check, call sooner if problems. Health Maintenance, Females A healthy lifestyle and preventative care can promote health and wellness.  Maintain regular health, dental, and eye exams.   Eat a healthy diet. Foods like vegetables, fruits, whole grains, low-fat dairy products, and lean protein foods contain the nutrients you need without too many calories. Decrease your intake of foods high in solid fats, added sugars, and salt. Get information about a proper diet from your caregiver, if necessary.   Regular physical exercise is one of the most important things you can do for your health. Most adults should get at least 150 minutes of moderate-intensity exercise (any activity that increases your heart rate and causes you to sweat) each week. In addition, most adults need muscle-strengthening exercises on 2 or more days a week.     Maintain a healthy weight. The body mass index (BMI) is a screening tool to identify possible weight problems. It provides an estimate of body fat based on height and weight. Your caregiver can  help determine your BMI, and can help you achieve or maintain a healthy weight. For adults 20 years and older:   A BMI below 18.5 is considered underweight.   A BMI of 18.5 to 24.9 is normal.   A BMI of 25 to 29.9 is considered overweight.   A BMI of 30 and above is considered obese.   Maintain normal blood lipids and cholesterol by exercising and minimizing your intake of saturated fat. Eat a balanced diet with plenty of fruits and vegetables. Blood tests for lipids and cholesterol should begin at age 81 and be repeated every 5 years. If your lipid or cholesterol levels are high, you are over 50, or you are a high risk for heart disease, you may need your cholesterol levels checked more frequently. Ongoing high lipid and cholesterol levels should be treated with medicines if diet and exercise are not effective.   If you smoke, find out from your caregiver how to quit. If you do not use tobacco, do not start.   If you are pregnant, do not drink alcohol. If you are breastfeeding, be very cautious about drinking alcohol. If you are not pregnant and choose to drink alcohol, do not exceed 1 drink per day. One drink is considered to be 12 ounces (355 mL) of beer, 5 ounces (148 mL) of wine, or 1.5 ounces (44 mL) of liquor.   Avoid use of street drugs. Do not share needles with anyone. Ask for help if you need support or instructions about stopping the use of drugs.   High blood pressure causes heart disease and increases the risk of stroke. Blood  pressure should be checked at least every 1 to 2 years. Ongoing high blood pressure should be treated with medicines, if weight loss and exercise are not effective.   If you are 9 to 50 years old, ask your caregiver if you should take aspirin to prevent strokes.   Diabetes screening involves taking a blood sample to check your fasting blood sugar level. This should be done once every 3 years, after age 70, if you are within normal weight and without risk  factors for diabetes. Testing should be considered at a younger age or be carried out more frequently if you are overweight and have at least 1 risk factor for diabetes.   Breast cancer screening is essential preventative care for women. You should practice "breast self-awareness." This means understanding the normal appearance and feel of your breasts and may include breast self-examination. Any changes detected, no matter how small, should be reported to a caregiver. Women in their 46s and 30s should have a clinical breast exam (CBE) by a caregiver as part of a regular health exam every 1 to 3 years. After age 40, women should have a CBE every year. Starting at age 26, women should consider having a mammogram (breast X-ray) every year. Women who have a family history of breast cancer should talk to their caregiver about genetic screening. Women at a high risk of breast cancer should talk to their caregiver about having an MRI and a mammogram every year.   The Pap test is a screening test for cervical cancer. Women should have a Pap test starting at age 36. Between ages 22 and 15, Pap tests should be repeated every 2 years. Beginning at age 77, you should have a Pap test every 3 years as long as the past 3 Pap tests have been normal. If you had a hysterectomy for a problem that was not cancer or a condition that could lead to cancer, then you no longer need Pap tests. If you are between ages 60 and 89, and you have had normal Pap tests going back 10 years, you no longer need Pap tests. If you have had past treatment for cervical cancer or a condition that could lead to cancer, you need Pap tests and screening for cancer for at least 20 years after your treatment. If Pap tests have been discontinued, risk factors (such as a new sexual partner) need to be reassessed to determine if screening should be resumed. Some women have medical problems that increase the chance of getting cervical cancer. In these cases,  your caregiver may recommend more frequent screening and Pap tests.   The human papillomavirus (HPV) test is an additional test that may be used for cervical cancer screening. The HPV test looks for the virus that can cause the cell changes on the cervix. The cells collected during the Pap test can be tested for HPV. The HPV test could be used to screen women aged 31 years and older, and should be used in women of any age who have unclear Pap test results. After the age of 42, women should have HPV testing at the same frequency as a Pap test.   Colorectal cancer can be detected and often prevented. Most routine colorectal cancer screening begins at the age of 58 and continues through age 85. However, your caregiver may recommend screening at an earlier age if you have risk factors for colon cancer. On a yearly basis, your caregiver may provide home test kits to check for  hidden blood in the stool. Use of a small camera at the end of a tube, to directly examine the colon (sigmoidoscopy or colonoscopy), can detect the earliest forms of colorectal cancer. Talk to your caregiver about this at age 59, when routine screening begins. Direct examination of the colon should be repeated every 5 to 10 years through age 41, unless early forms of pre-cancerous polyps or small growths are found.   Hepatitis C blood testing is recommended for all people born from [birth year] through 1964 and any individual with known risks for hepatitis C.   Practice safe sex. Use condoms and avoid high-risk sexual practices to reduce the spread of sexually transmitted infections (STIs). Sexually active women aged 68 and younger should be checked for Chlamydia, which is a common sexually transmitted infection. Older women with new or multiple partners should also be tested for Chlamydia. Testing for other STIs is recommended if you are sexually active and at increased risk.   Osteoporosis is a disease in which the bones lose minerals and  strength with aging. This can result in serious bone fractures. The risk of osteoporosis can be identified using a bone density scan. Women ages 70 and over and women at risk for fractures or osteoporosis should discuss screening with their caregivers. Ask your caregiver whether you should be taking a calcium supplement or vitamin D to reduce the rate of osteoporosis.   Menopause can be associated with physical symptoms and risks. Hormone replacement therapy is available to decrease symptoms and risks. You should talk to your caregiver about whether hormone replacement therapy is right for you.   Use sunscreen with a sun protection factor (SPF) of 30 or greater. Apply sunscreen liberally and repeatedly throughout the day. You should seek shade when your shadow is shorter than you. Protect yourself by wearing long sleeves, pants, a wide-brimmed hat, and sunglasses year round, whenever you are outdoors.   Notify your caregiver of new moles or changes in moles, especially if there is a change in shape or color. Also notify your caregiver if a mole is larger than the size of a pencil eraser.   Stay current with your immunizations.  Document Released: 03/17/2011 Document Revised: 11/24/2011 Document Reviewed: 03/17/2011 [facility] Patient Information 2013 [place], [place].   You Can Quit Smoking If you are ready to quit smoking or are thinking about it, congratulations! You have chosen to help yourself be healthier and live longer! There are lots of different ways to quit smoking. Nicotine gum, nicotine patches, a nicotine inhaler, or nicotine nasal spray can help with physical craving. Hypnosis, support groups, and medicines help break the habit of smoking. TIPS TO GET OFF AND STAY OFF CIGARETTES  Learn to predict your moods. Do not let a bad situation be your excuse to have a cigarette. Some situations in your life might tempt you to have a cigarette.   Ask friends and co-workers not to smoke around you.     Make your home smoke-free.   Never have "just one" cigarette. It leads to wanting another and another. Remind yourself of your decision to quit.   On a card, make a list of your reasons for not smoking. Read it at least the same number of times a day as you have a cigarette. Tell yourself everyday, "I do not want to smoke. I choose not to smoke."   Ask someone at home or work to help you with your plan to quit smoking.   Have something planned after  you eat or have a cup of coffee. Take a walk or get other exercise to perk you up. This will help to keep you from overeating.   Try a relaxation exercise to calm you down and decrease your stress. Remember, you may be tense and nervous the first two weeks after you quit. This will pass.   Find new activities to keep your hands busy. Play with a pen, coin, or rubber band. Doodle or draw things on paper.   Brush your teeth right after eating. This will help cut down the craving for the taste of tobacco after meals. You can try mouthwash too.   Try gum, breath mints, or diet candy to keep something in your mouth.  IF YOU SMOKE AND WANT TO QUIT:  Do not stock up on cigarettes. Never buy a carton. Wait until one pack is finished before you buy another.   Never carry cigarettes with you at work or at home.   Keep cigarettes as far away from you as possible. Leave them with someone else.   Never carry matches or a lighter with you.   Ask yourself, "Do I need this cigarette or is this just a reflex?"   Bet with someone that you can quit. Put cigarette money in a piggy bank every morning. If you smoke, you give up the money. If you do not smoke, by the end of the week, you keep the money.   Keep trying. It takes 21 days to change a habit!   Talk to your doctor about using medicines to help you quit. These include nicotine replacement gum, lozenges, or skin patches.  Document Released: 06/28/2009 Document Revised: 11/24/2011 Document  Reviewed: 06/28/2009 [facility] Patient Information 2013 [place], [place].

## 2012-08-02 NOTE — Assessment & Plan Note (Signed)
Check now and adjust as needed Lab Results  Component Value Date   TSH 0.33* 06/07/2010

## 2012-08-02 NOTE — Assessment & Plan Note (Signed)
Med changes reviewed - updated On Seroquel and Li Reports mood stable Check labs today

## 2012-08-03 ENCOUNTER — Other Ambulatory Visit: Payer: Self-pay | Admitting: *Deleted

## 2012-08-03 MED ORDER — NICOTINE 21 MG/24HR TD PT24: MEDICATED_PATCH | TRANSDERMAL | Status: AC

## 2012-08-03 MED ORDER — LEVOTHYROXINE SODIUM 88 MCG PO TABS: 88.0000 ug | ORAL_TABLET | Freq: Every day | ORAL | Status: DC

## 2012-08-03 NOTE — Telephone Encounter (Signed)
Left msg on vm thyroid medicine wasn't sent to pharmacy. Called pt back to verify pharmacy did not answer. LMOM RTC...lmb

## 2012-08-03 NOTE — Telephone Encounter (Signed)
Pt call back needing rx's to go to [facility]...Kathleen Day

## 2012-08-06 ENCOUNTER — Ambulatory Visit (HOSPITAL_BASED_OUTPATIENT_CLINIC_OR_DEPARTMENT_OTHER): Payer: Self-pay

## 2012-08-20 ENCOUNTER — Ambulatory Visit (HOSPITAL_BASED_OUTPATIENT_CLINIC_OR_DEPARTMENT_OTHER)
Admission: RE | Admit: 2012-08-20 | Discharge: 2012-08-20 | Disposition: A | Discharge status: Home / Self Care | Payer: Medicare Other | Source: Ambulatory Visit | Attending: Internal Medicine | Admitting: Internal Medicine

## 2012-08-20 DIAGNOSIS — Z1231 Encounter for screening mammogram for malignant neoplasm of breast: Secondary | ICD-10-CM

## 2012-09-15 LAB — HM PAP SMEAR

## 2012-11-30 ENCOUNTER — Encounter: Payer: Self-pay | Admitting: Internal Medicine

## 2012-11-30 ENCOUNTER — Ambulatory Visit (INDEPENDENT_AMBULATORY_CARE_PROVIDER_SITE_OTHER): Payer: Medicare Other | Admitting: Internal Medicine

## 2012-11-30 VITALS — BP 130/82 | HR 69 | Temp 98.2°F | Wt 145.0 lb

## 2012-11-30 DIAGNOSIS — M545 Low back pain: Secondary | ICD-10-CM

## 2012-11-30 DIAGNOSIS — L608 Other nail disorders: Secondary | ICD-10-CM

## 2012-11-30 DIAGNOSIS — F319 Bipolar disorder, unspecified: Secondary | ICD-10-CM

## 2012-11-30 MED ORDER — NAPROXEN SODIUM 220 MG PO TABS: 220.0000 mg | ORAL_TABLET | Freq: Three times a day (TID) | ORAL | Status: AC | PRN

## 2012-11-30 MED ORDER — MENTHOL (TOPICAL ANALGESIC) 16 % EX LIQD: 1.0000 "application " | CUTANEOUS | Status: AC | PRN

## 2012-11-30 NOTE — Assessment & Plan Note (Signed)
Med changes reviewed and updated On Seroquel and Lithium Reports mood stable Continue management as per behavioral health provider at [facility], no changes recommended today

## 2012-11-30 NOTE — Assessment & Plan Note (Signed)
Chronic lumbar pain symptoms, uses tramadol and over-the-counter Aleve for management of same The current medical regimen is effective;  continue present plan and medications.

## 2012-11-30 NOTE — Patient Instructions (Addendum)
It was good to see you today. We have reviewed your prior records including labs and tests today Printed prescriptions for OTC medications provided as requested for [employer] we'll make referral to podiatry for toenail check . Our office will contact you regarding appointment(s) once made. Continue working with your other specialists at [facility] as ongoing Please schedule followup in 6 months for thyroid and lab check, call sooner if problems.
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
## 2013-02-01 ENCOUNTER — Ambulatory Visit: Payer: Self-pay | Admitting: Internal Medicine

## 2013-07-25 LAB — TSH: TSH: 7.87 u[IU]/mL — AB (ref 0.41–5.90)

## 2013-08-03 ENCOUNTER — Encounter: Payer: Self-pay | Admitting: Nurse Practitioner

## 2013-08-03 ENCOUNTER — Ambulatory Visit: Payer: Medicare Other | Admitting: Nurse Practitioner

## 2013-09-01 ENCOUNTER — Other Ambulatory Visit: Payer: Self-pay | Admitting: Internal Medicine

## 2013-10-01 ENCOUNTER — Other Ambulatory Visit: Payer: Self-pay | Admitting: Internal Medicine

## 2013-10-27 ENCOUNTER — Encounter: Payer: Self-pay | Admitting: Internal Medicine

## 2013-10-27 ENCOUNTER — Ambulatory Visit (INDEPENDENT_AMBULATORY_CARE_PROVIDER_SITE_OTHER): Payer: Medicare Other | Admitting: Internal Medicine

## 2013-10-27 ENCOUNTER — Other Ambulatory Visit (INDEPENDENT_AMBULATORY_CARE_PROVIDER_SITE_OTHER): Payer: Medicare Other

## 2013-10-27 VITALS — BP 142/90 | HR 84 | Temp 98.5°F | Wt 142.4 lb

## 2013-10-27 DIAGNOSIS — E039 Hypothyroidism, unspecified: Secondary | ICD-10-CM

## 2013-10-27 DIAGNOSIS — Z0000 Encounter for general adult medical examination without abnormal findings: Secondary | ICD-10-CM

## 2013-10-27 DIAGNOSIS — F319 Bipolar disorder, unspecified: Secondary | ICD-10-CM

## 2013-10-27 DIAGNOSIS — Z1211 Encounter for screening for malignant neoplasm of colon: Secondary | ICD-10-CM

## 2013-10-27 DIAGNOSIS — Z23 Encounter for immunization: Secondary | ICD-10-CM

## 2013-10-27 DIAGNOSIS — F17200 Nicotine dependence, unspecified, uncomplicated: Secondary | ICD-10-CM

## 2013-10-27 LAB — LIPID PANEL
Cholesterol: 211 mg/dL — ABNORMAL HIGH (ref 0–200)
HDL: 69.7 mg/dL (ref >39.00)
Total CHOL/HDL Ratio: 3
Triglycerides: 127 mg/dL (ref 0.0–149.0)
VLDL: 25.4 mg/dL (ref 0.0–40.0)

## 2013-10-27 LAB — LDL CHOLESTEROL, DIRECT: LDL DIRECT: 127 mg/dL

## 2013-10-27 LAB — TSH: TSH: 1.4 u[IU]/mL (ref 0.35–5.50)

## 2013-10-27 MED ORDER — ALBUTEROL SULFATE HFA 108 (90 BASE) MCG/ACT IN AERS: 2.0000 | INHALATION_SPRAY | Freq: Four times a day (QID) | RESPIRATORY_TRACT | Status: AC | PRN

## 2013-10-27 NOTE — Assessment & Plan Note (Signed)
Check now and adjust as needed Lab Results  Component Value Date   TSH 7.87* 07/25/2013
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
## 2013-10-27 NOTE — Progress Notes (Signed)
Pre-visit discussion using our clinic review tool. No additional management support is needed unless otherwise documented below in the visit note.  

## 2013-10-27 NOTE — Patient Instructions (Addendum)
It was good to see you today.  We have reviewed your prior records including labs and tests today  Health Maintenance reviewed - flu shot updated today - all other recommended immunizations and age-appropriate screenings are up-to-date.  we'll make referral to gastroenterology in [facility] for colonoscopy screening. Our office will contact you regarding appointment(s) once made.  Test(s) ordered today. Your results will be released to MyChart (or called to you) after review, usually within 72hours after test completion. If any changes need to be made, you will be notified at that same time.  Medications reviewed and updated, no changes recommended at this time.  Continue to think about giving up cigarettes! Use nicotine gum, nicotine patches or electronic cigarettes. If you're interested in medication to help you quit, please call  - let me know how I can help!  Please schedule followup in 12 months for annual exam and labs, call sooner if problems.   Health Maintenance, Female A healthy lifestyle and preventative care can promote health and wellness.  Maintain regular health, dental, and eye exams.  Eat a healthy diet. Foods like vegetables, fruits, whole grains, low-fat dairy products, and lean protein foods contain the nutrients you need without too many calories. Decrease your intake of foods high in solid fats, added sugars, and salt. Get information about a proper diet from your caregiver, if necessary.  Regular physical exercise is one of the most important things you can do for your health. Most adults should get at least 150 minutes of moderate-intensity exercise (any activity that increases your heart rate and causes you to sweat) each week. In addition, most adults need muscle-strengthening exercises on 2 or more days a week.   Maintain a healthy weight. The body mass index (BMI) is a screening tool to identify possible weight problems. It provides an estimate of body fat based  on height and weight. Your caregiver can help determine your BMI, and can help you achieve or maintain a healthy weight. For adults 20 years and older:  A BMI below 18.5 is considered underweight.  A BMI of 18.5 to 24.9 is normal.  A BMI of 25 to 29.9 is considered overweight.  A BMI of 30 and above is considered obese.  Maintain normal blood lipids and cholesterol by exercising and minimizing your intake of saturated fat. Eat a balanced diet with plenty of fruits and vegetables. Blood tests for lipids and cholesterol should begin at age 13 and be repeated every 5 years. If your lipid or cholesterol levels are high, you are over 50, or you are a high risk for heart disease, you may need your cholesterol levels checked more frequently.Ongoing high lipid and cholesterol levels should be treated with medicines if diet and exercise are not effective.  If you smoke, find out from your caregiver how to quit. If you do not use tobacco, do not start.  Lung cancer screening is recommended for adults aged 50 80 years who are at high risk for developing lung cancer because of a history of smoking. Yearly low-dose computed tomography (CT) is recommended for people who have at least a 30-pack-year history of smoking and are a current smoker or have quit within the past 15 years. A pack year of smoking is smoking an average of 1 pack of cigarettes a day for 1 year (for example: 1 pack a day for 30 years or 2 packs a day for 15 years). Yearly screening should continue until the smoker has stopped smoking for  at least 15 years. Yearly screening should also be stopped for people who develop a health problem that would prevent them from having lung cancer treatment.  If you are pregnant, do not drink alcohol. If you are breastfeeding, be very cautious about drinking alcohol. If you are not pregnant and choose to drink alcohol, do not exceed 1 drink per day. One drink is considered to be 12 ounces (355 mL) of beer, 5  ounces (148 mL) of wine, or 1.5 ounces (44 mL) of liquor.  Avoid use of street drugs. Do not share needles with anyone. Ask for help if you need support or instructions about stopping the use of drugs.  High blood pressure causes heart disease and increases the risk of stroke. Blood pressure should be checked at least every 1 to 2 years. Ongoing high blood pressure should be treated with medicines, if weight loss and exercise are not effective.  If you are 86 to 52 years old, ask your caregiver if you should take aspirin to prevent strokes.  Diabetes screening involves taking a blood sample to check your fasting blood sugar level. This should be done once every 3 years, after age 63, if you are within normal weight and without risk factors for diabetes. Testing should be considered at a younger age or be carried out more frequently if you are overweight and have at least 1 risk factor for diabetes.  Breast cancer screening is essential preventative care for women. You should practice "breast self-awareness." This means understanding the normal appearance and feel of your breasts and may include breast self-examination. Any changes detected, no matter how small, should be reported to a caregiver. Women in their 22s and 30s should have a clinical breast exam (CBE) by a caregiver as part of a regular health exam every 1 to 3 years. After age 57, women should have a CBE every year. Starting at age 30, women should consider having a mammogram (breast X-ray) every year. Women who have a family history of breast cancer should talk to their caregiver about genetic screening. Women at a high risk of breast cancer should talk to their caregiver about having an MRI and a mammogram every year.  Breast cancer gene (BRCA)-related cancer risk assessment is recommended for women who have family members with BRCA-related cancers. BRCA-related cancers include breast, ovarian, tubal, and peritoneal cancers. Having family  members with these cancers may be associated with an increased risk for harmful changes (mutations) in the breast cancer genes BRCA1 and BRCA2. Results of the assessment will determine the need for genetic counseling and BRCA1 and BRCA2 testing.  The Pap test is a screening test for cervical cancer. Women should have a Pap test starting at age 18. Between ages 31 and 35, Pap tests should be repeated every 2 years. Beginning at age 48, you should have a Pap test every 3 years as long as the past 3 Pap tests have been normal. If you had a hysterectomy for a problem that was not cancer or a condition that could lead to cancer, then you no longer need Pap tests. If you are between ages 32 and 19, and you have had normal Pap tests going back 10 years, you no longer need Pap tests. If you have had past treatment for cervical cancer or a condition that could lead to cancer, you need Pap tests and screening for cancer for at least 20 years after your treatment. If Pap tests have been discontinued, risk factors (such as  a new sexual partner) need to be reassessed to determine if screening should be resumed. Some women have medical problems that increase the chance of getting cervical cancer. In these cases, your caregiver may recommend more frequent screening and Pap tests.  The human papillomavirus (HPV) test is an additional test that may be used for cervical cancer screening. The HPV test looks for the virus that can cause the cell changes on the cervix. The cells collected during the Pap test can be tested for HPV. The HPV test could be used to screen women aged 34 years and older, and should be used in women of any age who have unclear Pap test results. After the age of 42, women should have HPV testing at the same frequency as a Pap test.  Colorectal cancer can be detected and often prevented. Most routine colorectal cancer screening begins at the age of 63 and continues through age 68. However, your caregiver  may recommend screening at an earlier age if you have risk factors for colon cancer. On a yearly basis, your caregiver may provide home test kits to check for hidden blood in the stool. Use of a small camera at the end of a tube, to directly examine the colon (sigmoidoscopy or colonoscopy), can detect the earliest forms of colorectal cancer. Talk to your caregiver about this at age 42, when routine screening begins. Direct examination of the colon should be repeated every 5 to 10 years through age 61, unless early forms of pre-cancerous polyps or small growths are found.  Hepatitis C blood testing is recommended for all people born from [birth year] through 1964 and any individual with known risks for hepatitis C.  Practice safe sex. Use condoms and avoid high-risk sexual practices to reduce the spread of sexually transmitted infections (STIs). Sexually active women aged 79 and younger should be checked for Chlamydia, which is a common sexually transmitted infection. Older women with new or multiple partners should also be tested for Chlamydia. Testing for other STIs is recommended if you are sexually active and at increased risk.  Osteoporosis is a disease in which the bones lose minerals and strength with aging. This can result in serious bone fractures. The risk of osteoporosis can be identified using a bone density scan. Women ages 70 and over and women at risk for fractures or osteoporosis should discuss screening with their caregivers. Ask your caregiver whether you should be taking a calcium supplement or vitamin D to reduce the rate of osteoporosis.  Menopause can be associated with physical symptoms and risks. Hormone replacement therapy is available to decrease symptoms and risks. You should talk to your caregiver about whether hormone replacement therapy is right for you.  Use sunscreen. Apply sunscreen liberally and repeatedly throughout the day. You should seek shade when your shadow is shorter than  you. Protect yourself by wearing long sleeves, pants, a wide-brimmed hat, and sunglasses year round, whenever you are outdoors.  Notify your caregiver of new moles or changes in moles, especially if there is a change in shape or color. Also notify your caregiver if a mole is larger than the size of a pencil eraser.  Stay current with your immunizations. Document Released: 03/17/2011 Document Revised: 12/27/2012 Document Reviewed: 03/17/2011 [facility] Patient Information 2014 [place]. You Can Quit Smoking If you are ready to quit smoking or are thinking about it, congratulations! You have chosen to help yourself be healthier and live longer! There are lots of different ways to quit smoking. Nicotine  gum, nicotine patches, a nicotine inhaler, or nicotine nasal spray can help with physical craving. Hypnosis, support groups, and medicines help break the habit of smoking. TIPS TO GET OFF AND STAY OFF CIGARETTES  Learn to predict your moods. Do not let a bad situation be your excuse to have a cigarette. Some situations in your life might tempt you to have a cigarette.  Ask friends and co-workers not to smoke around you.  Make your home smoke-free.  Never have "just one" cigarette. It leads to wanting another and another. Remind yourself of your decision to quit.  On a card, make a list of your reasons for not smoking. Read it at least the same number of times a day as you have a cigarette. Tell yourself everyday, "I do not want to smoke. I choose not to smoke."  Ask someone at home or work to help you with your plan to quit smoking.  Have something planned after you eat or have a cup of coffee. Take a walk or get other exercise to perk you up. This will help to keep you from overeating.  Try a relaxation exercise to calm you down and decrease your stress. Remember, you may be tense and nervous the first two weeks after you quit. This will pass.  Find new activities to keep your hands  busy. Play with a pen, coin, or rubber band. Doodle or draw things on paper.  Brush your teeth right after eating. This will help cut down the craving for the taste of tobacco after meals. You can try mouthwash too.  Try gum, breath mints, or diet candy to keep something in your mouth. IF YOU SMOKE AND WANT TO QUIT:  Do not stock up on cigarettes. Never buy a carton. Wait until one pack is finished before you buy another.  Never carry cigarettes with you at work or at home.  Keep cigarettes as far away from you as possible. Leave them with someone else.  Never carry matches or a lighter with you.  Ask yourself, "Do I need this cigarette or is this just a reflex?"  Bet with someone that you can quit. Put cigarette money in a piggy bank every morning. If you smoke, you give up the money. If you do not smoke, by the end of the week, you keep the money.  Keep trying. It takes 21 days to change a habit!  Talk to your doctor about using medicines to help you quit. These include nicotine replacement gum, lozenges, or skin patches. Document Released: 06/28/2009 Document Revised: 11/24/2011 Document Reviewed: 06/28/2009 [facility] Patient Information 2014 [place].

## 2013-10-27 NOTE — Assessment & Plan Note (Signed)
5 minutes today spent counseling patient on unhealthy effects of continued tobacco abuse and encouragement of cessation including medical options available to help the patient quit smoking. 

## 2013-10-27 NOTE — Assessment & Plan Note (Signed)
Med changes reviewed and updated On Seroquel and Lithium Reports mood stable Continue management as per behavioral health provider, [facility] [place] [facility] [place] No change recommended today

## 2013-10-28 ENCOUNTER — Telehealth: Payer: Self-pay | Admitting: *Deleted

## 2013-10-28 NOTE — Telephone Encounter (Signed)
Tried calling pt back no answer, can't leave msg due to VM being full. Only wanted to inform her labs was ok. Msg was left on her home phone...Raechel Chute/lmb

## 2013-10-28 NOTE — Telephone Encounter (Signed)
Patient phoned at 1027 attempting to return Lucy's phone call.  CB# [phone]

## 2013-10-28 NOTE — Telephone Encounter (Signed)
Pt return call back gave her md response concerning labs...Raechel Chute/lmb

## 2013-12-06 ENCOUNTER — Other Ambulatory Visit: Payer: Self-pay | Admitting: Internal Medicine

## 2014-04-06 ENCOUNTER — Encounter: Payer: Self-pay | Admitting: Internal Medicine

## 2014-09-28 DIAGNOSIS — Z79899 Other long term (current) drug therapy: Secondary | ICD-10-CM | POA: Diagnosis not present

## 2014-10-09 DIAGNOSIS — M549 Dorsalgia, unspecified: Secondary | ICD-10-CM | POA: Diagnosis not present

## 2014-11-09 DIAGNOSIS — M549 Dorsalgia, unspecified: Secondary | ICD-10-CM | POA: Diagnosis not present

## 2014-12-08 DIAGNOSIS — M549 Dorsalgia, unspecified: Secondary | ICD-10-CM | POA: Diagnosis not present

## 2015-01-06 ENCOUNTER — Other Ambulatory Visit: Payer: Self-pay | Admitting: Internal Medicine

## 2015-01-08 DIAGNOSIS — M549 Dorsalgia, unspecified: Secondary | ICD-10-CM | POA: Diagnosis not present

## 2015-02-07 DIAGNOSIS — M549 Dorsalgia, unspecified: Secondary | ICD-10-CM | POA: Diagnosis not present

## 2015-03-08 DIAGNOSIS — F329 Major depressive disorder, single episode, unspecified: Secondary | ICD-10-CM | POA: Diagnosis not present

## 2015-03-10 DIAGNOSIS — M549 Dorsalgia, unspecified: Secondary | ICD-10-CM | POA: Diagnosis not present

## 2015-04-06 DIAGNOSIS — F329 Major depressive disorder, single episode, unspecified: Secondary | ICD-10-CM | POA: Diagnosis not present

## 2015-09-09 DIAGNOSIS — M549 Dorsalgia, unspecified: Secondary | ICD-10-CM | POA: Diagnosis not present

## 2015-10-10 DIAGNOSIS — M549 Dorsalgia, unspecified: Secondary | ICD-10-CM | POA: Diagnosis not present

## 2015-11-10 DIAGNOSIS — M549 Dorsalgia, unspecified: Secondary | ICD-10-CM | POA: Diagnosis not present

## 2015-12-08 DIAGNOSIS — M549 Dorsalgia, unspecified: Secondary | ICD-10-CM | POA: Diagnosis not present

## 2022-07-08 ENCOUNTER — Other Ambulatory Visit: Payer: Self-pay | Admitting: Endocrinology

## 2022-07-08 DIAGNOSIS — Z1231 Encounter for screening mammogram for malignant neoplasm of breast: Secondary | ICD-10-CM
